# Patient Record
Sex: Male | Born: 1945 | Race: White | Hispanic: No | Smoking: Never smoker
Health system: Southern US, Community
[De-identification: ages and names within clinical notes are randomized; demographics above are authoritative.]

## PROBLEM LIST (undated history)

## (undated) DIAGNOSIS — I10 Essential (primary) hypertension: Secondary | ICD-10-CM

## (undated) DIAGNOSIS — M419 Scoliosis, unspecified: Secondary | ICD-10-CM

## (undated) DIAGNOSIS — F319 Bipolar disorder, unspecified: Secondary | ICD-10-CM

## (undated) DIAGNOSIS — E7800 Pure hypercholesterolemia, unspecified: Secondary | ICD-10-CM

## (undated) DIAGNOSIS — M1990 Unspecified osteoarthritis, unspecified site: Secondary | ICD-10-CM

## (undated) DIAGNOSIS — K649 Unspecified hemorrhoids: Secondary | ICD-10-CM

## (undated) HISTORY — DX: Unspecified osteoarthritis, unspecified site: M19.90

## (undated) HISTORY — DX: Scoliosis, unspecified: M41.9

## (undated) HISTORY — DX: Essential (primary) hypertension: I10

## (undated) HISTORY — DX: Unspecified hemorrhoids: K64.9

## (undated) HISTORY — DX: Bipolar disorder, unspecified: F31.9

## (undated) HISTORY — DX: Pure hypercholesterolemia, unspecified: E78.00

---

## 2001-09-12 ENCOUNTER — Inpatient Hospital Stay (HOSPITAL_COMMUNITY): Admission: EM | Admit: 2001-09-12 | Discharge: 2001-09-16 | Payer: Self-pay | Admitting: *Deleted

## 2004-04-24 ENCOUNTER — Emergency Department: Payer: Self-pay | Admitting: Emergency Medicine

## 2004-04-24 ENCOUNTER — Other Ambulatory Visit: Payer: Self-pay

## 2005-11-04 ENCOUNTER — Other Ambulatory Visit: Payer: Self-pay

## 2005-11-04 ENCOUNTER — Inpatient Hospital Stay: Payer: Self-pay | Admitting: Internal Medicine

## 2008-02-25 ENCOUNTER — Ambulatory Visit: Payer: Self-pay | Admitting: Unknown Physician Specialty

## 2008-04-14 ENCOUNTER — Ambulatory Visit: Payer: Self-pay | Admitting: Nephrology

## 2009-08-14 IMAGING — US US RENAL KIDNEY
1 series · 17 of 25 positions shown · non-contrast
Comparison: none

REASON FOR EXAM: chronic Kidney disease
COMMENTS:

[Series 1: us renal kidney · 17 of 39 slices shown]
[im 1/39]
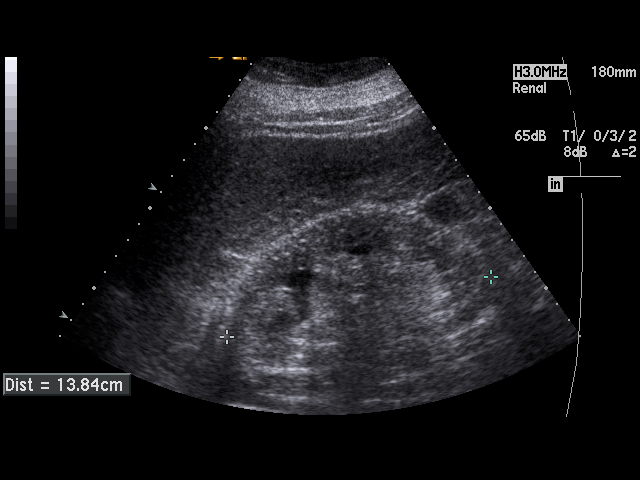
[im 4/39]
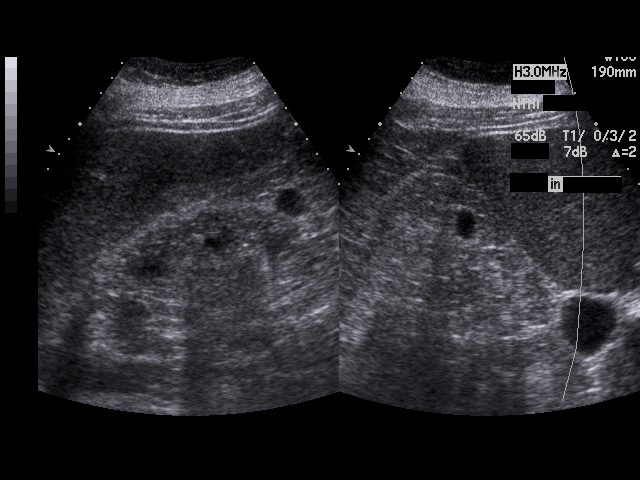
[im 5/39]
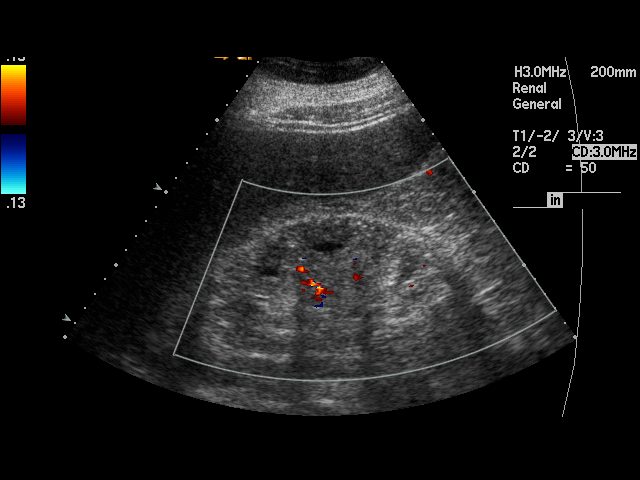
[im 8/39]
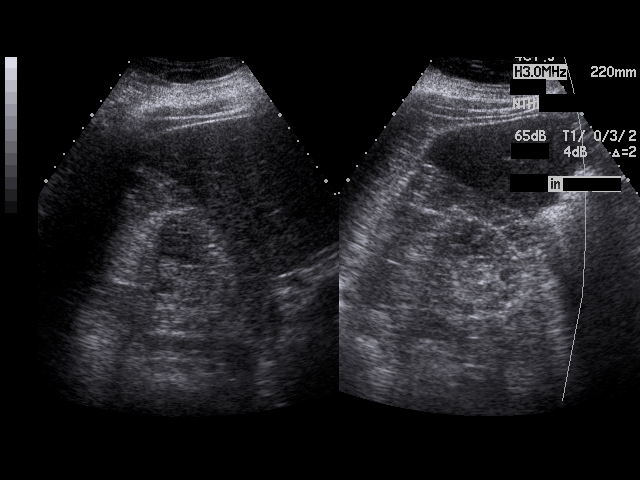
[im 10/39]
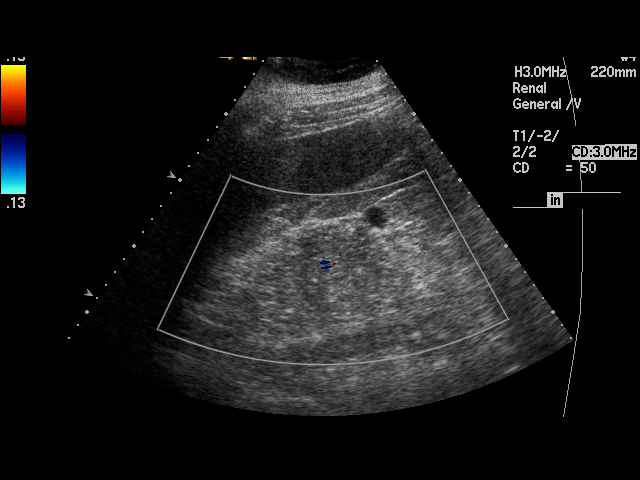
[im 13/39]
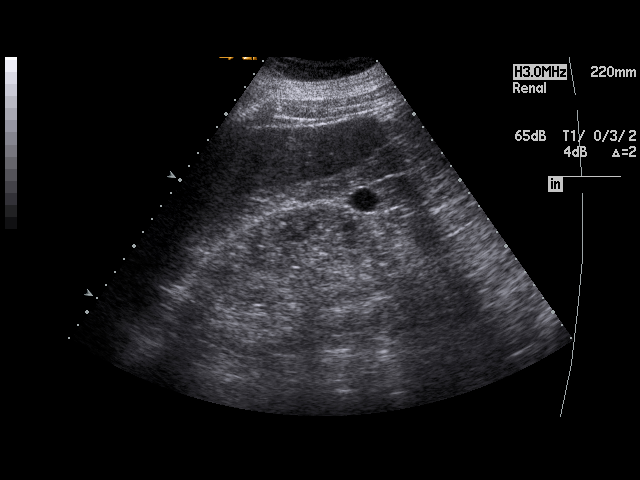
[im 15/39]
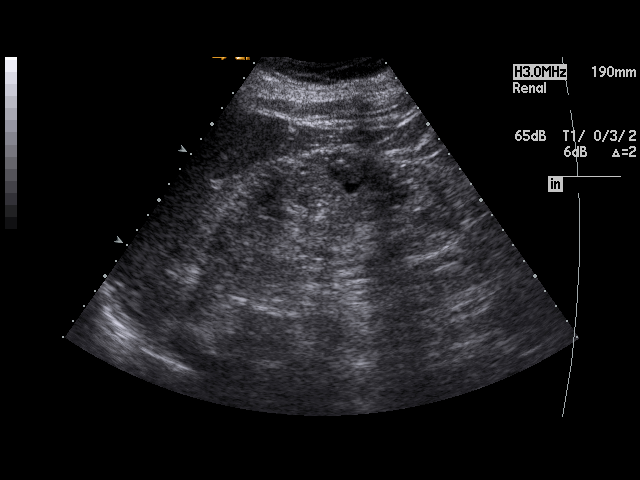
[im 18/39]
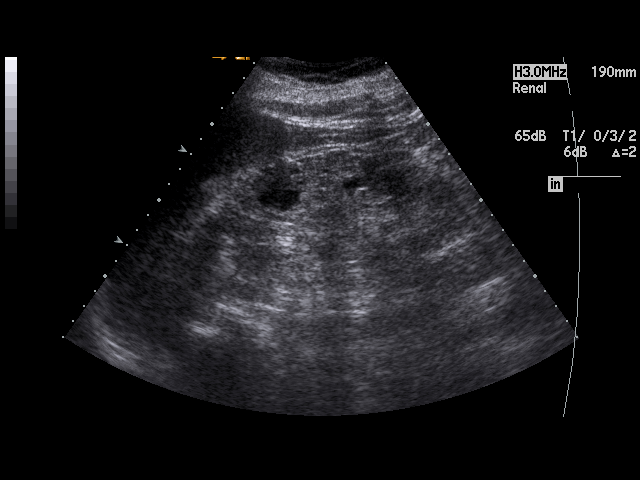
[im 20/39]
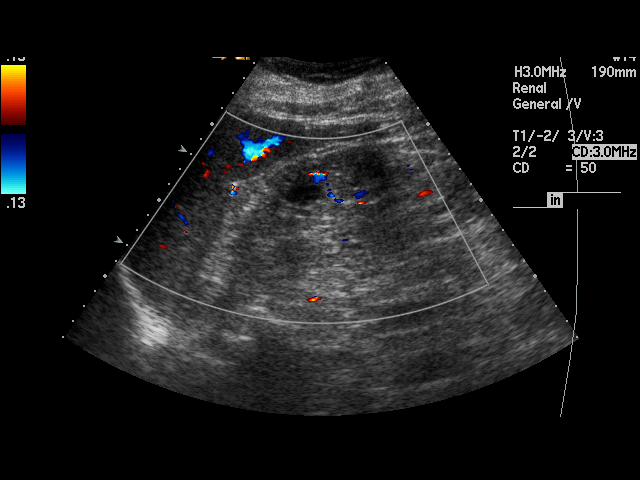
[im 21/39]
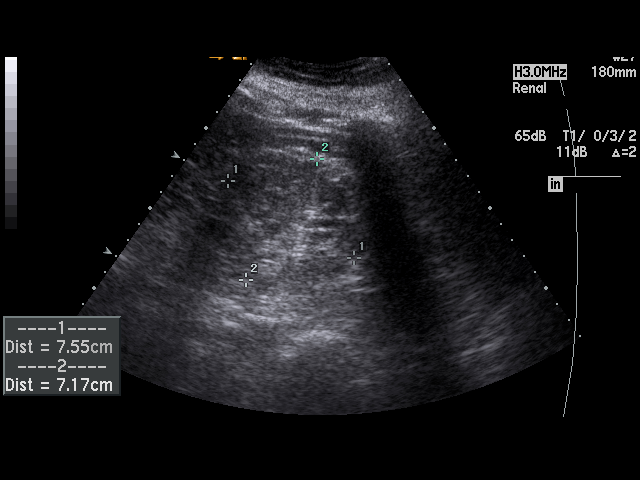
[im 24/39]
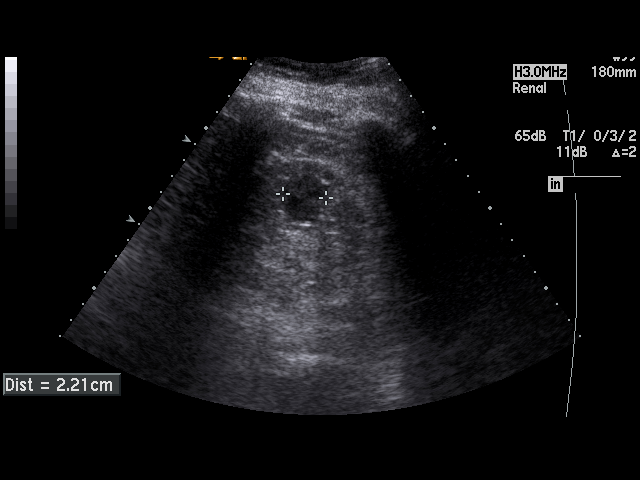
[im 26/39]
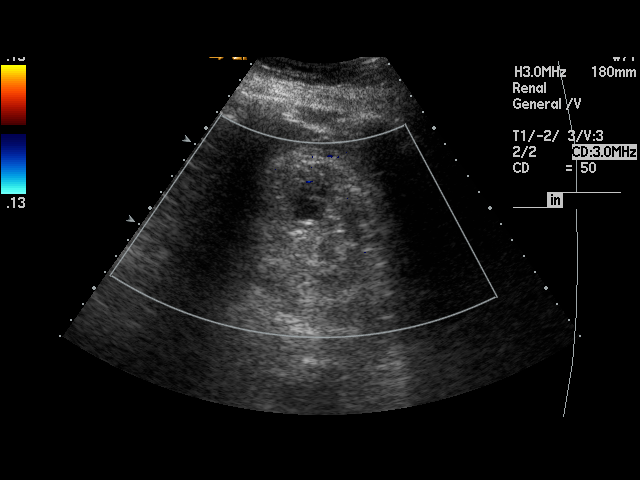
[im 29/39]
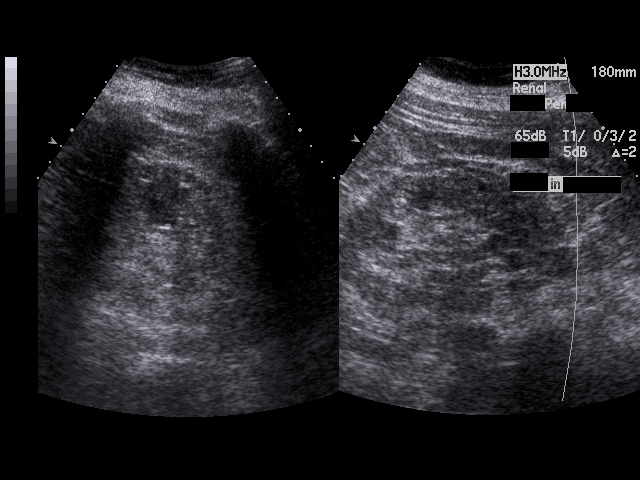
[im 31/39]
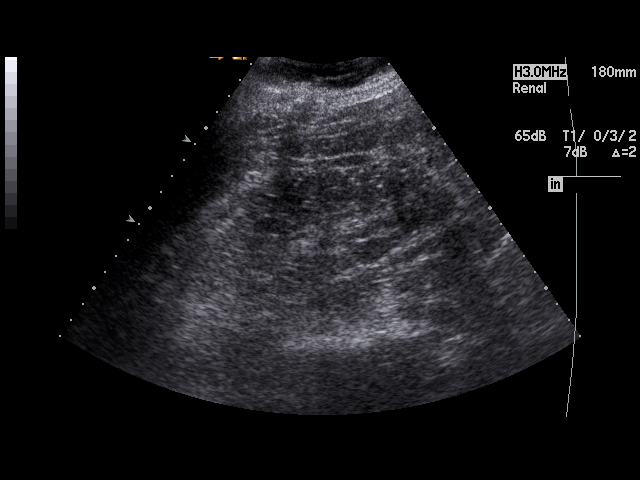
[im 34/39]
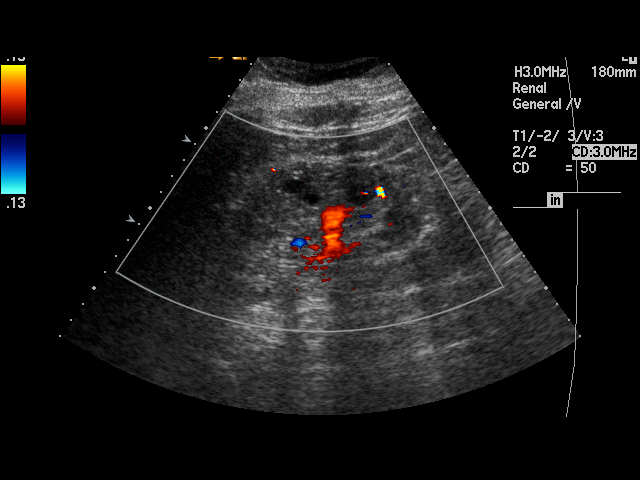
[im 35/39]
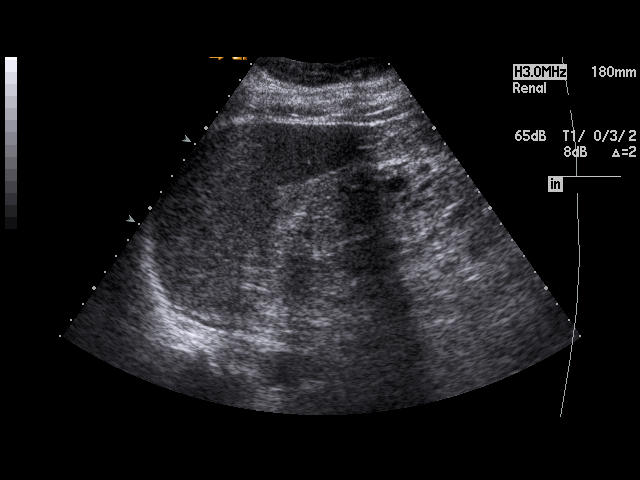
[im 39/39]
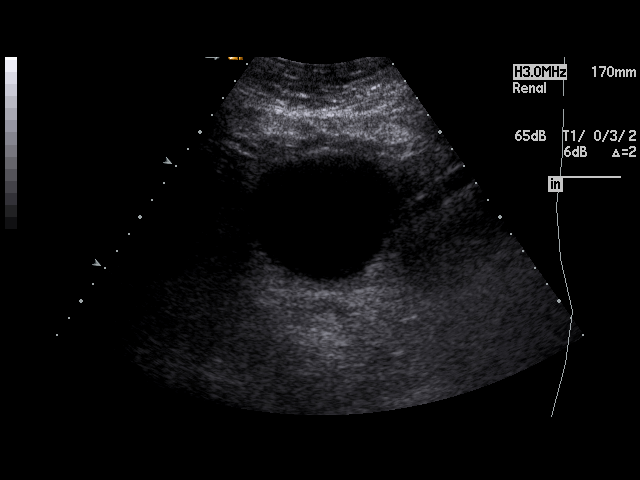

[17 of 25 positions shown; findings below may reference images not displayed]

PROCEDURE:     US  - US KIDNEY  - April 14, 2008  [DATE]

RESULT:     The kidneys bilaterally are hyperechogenic consistent with the
clinical history of renal insufficiency. The RIGHT kidney measures 13.84 cm
x 8.21 cm x 8.22 cm and the LEFT kidney measures 15.24 cm x 7.55 cm x
cm. There is 1.78 cm exophytic cyst at the lower pole of the RIGHT kidney.
In the LEFT kidney, there is a hypoechoic mass measuring 3.02 cm at maximum
diameter and located near the junction of the LEFT upper pole and LEFT
midpole region. There is at least some cystic component although echoes are
visualized in the major portion of the mass. The etiology for the mass is
not sonographically specific. A complex solid mass could produce this
appearance as could a cyst containing debris. No renal calcifications are
seen. There is no hydronephrosis. The urinary bladder is normal in
appearance.
IMPRESSION: 1. The kidneys show increased echogenicity bilaterally consistent with the
clinical history of renal insufficiency.
2. The renal cortical margins are smooth.
3. There is no hydronephrosis.
4. Incidentally noted is a cyst of the RIGHT kidney.
5. There is a nonspecific hypoechoic mass of the LEFT kidney as noted above.

## 2009-12-30 ENCOUNTER — Emergency Department: Payer: Self-pay | Admitting: Emergency Medicine

## 2011-03-09 ENCOUNTER — Encounter: Payer: Self-pay | Admitting: Family Medicine

## 2011-03-17 ENCOUNTER — Encounter: Payer: Self-pay | Admitting: Family Medicine

## 2011-04-16 ENCOUNTER — Encounter: Payer: Self-pay | Admitting: Family Medicine

## 2011-05-01 IMAGING — CR RIGHT FOOT COMPLETE - 3+ VIEW
1 series · 3 of 3 positions shown · non-contrast
Comparison: none

REASON FOR EXAM: pain intermittently for "awhile" worse this past week
COMMENTS:

[Series 1: view not recorded · 0.17mm/px · 3 of 3 slices shown]
[im 1/3]
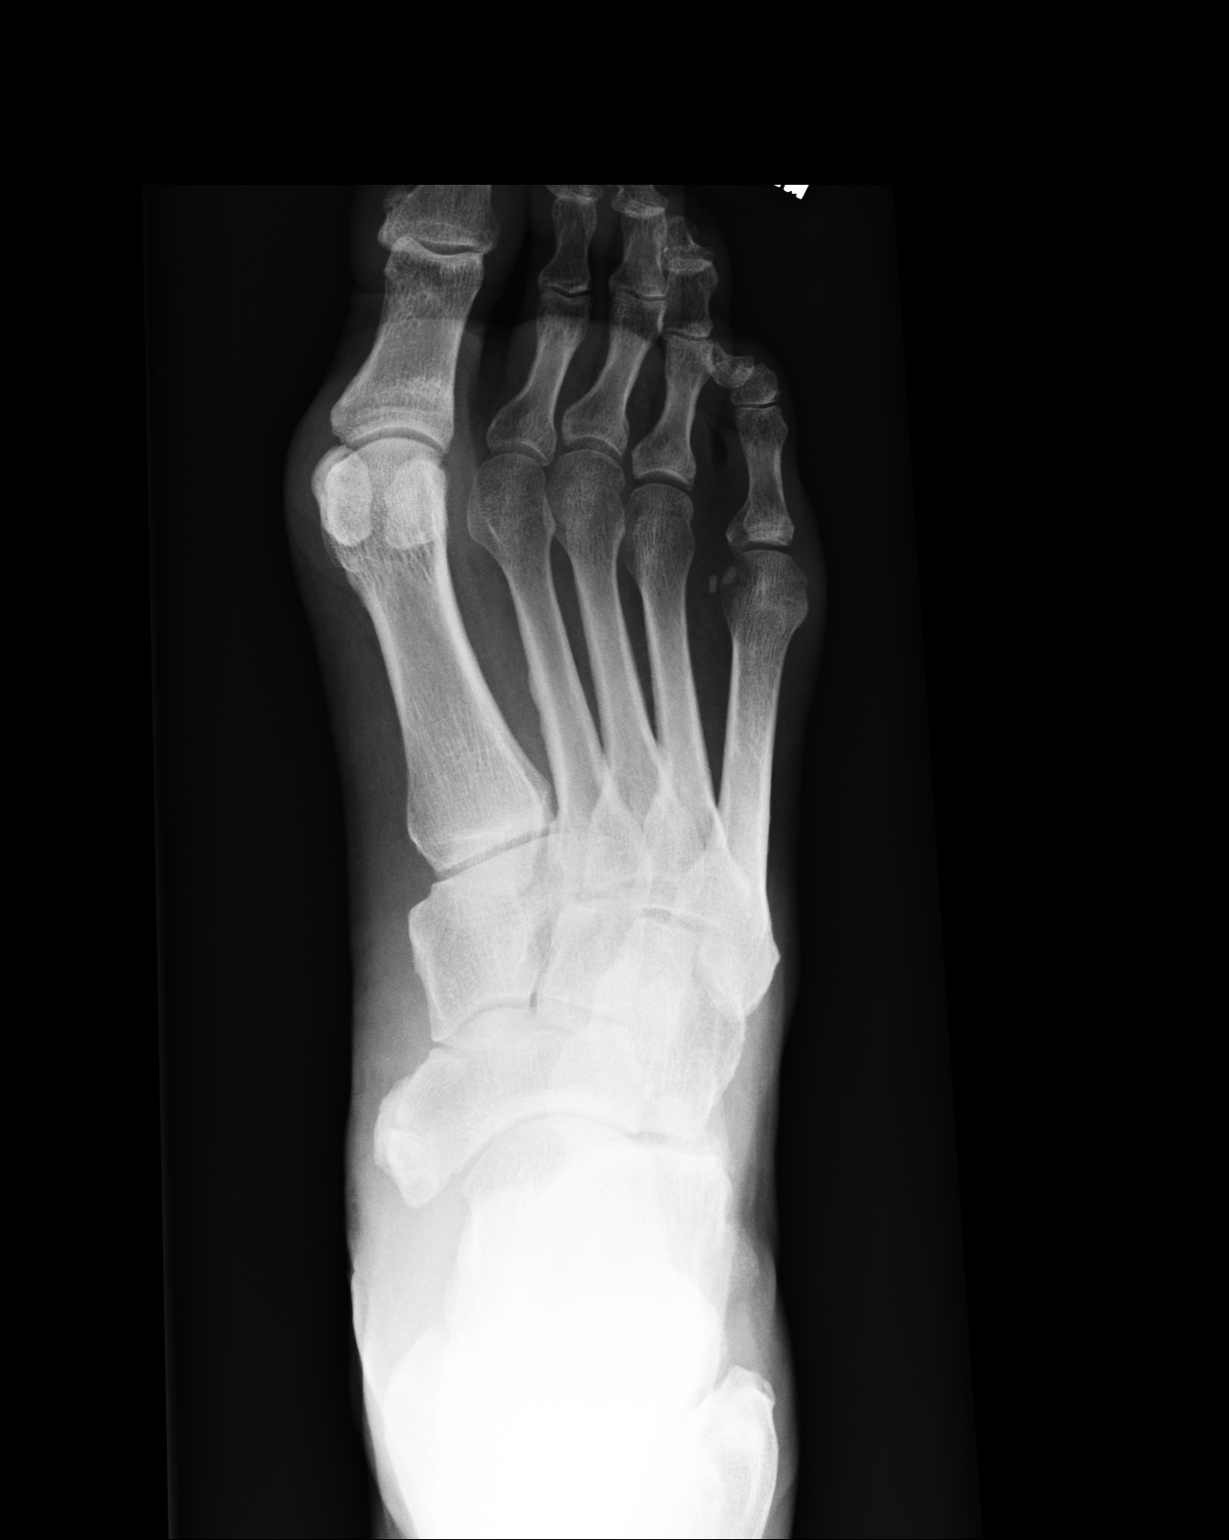
[im 2/3]
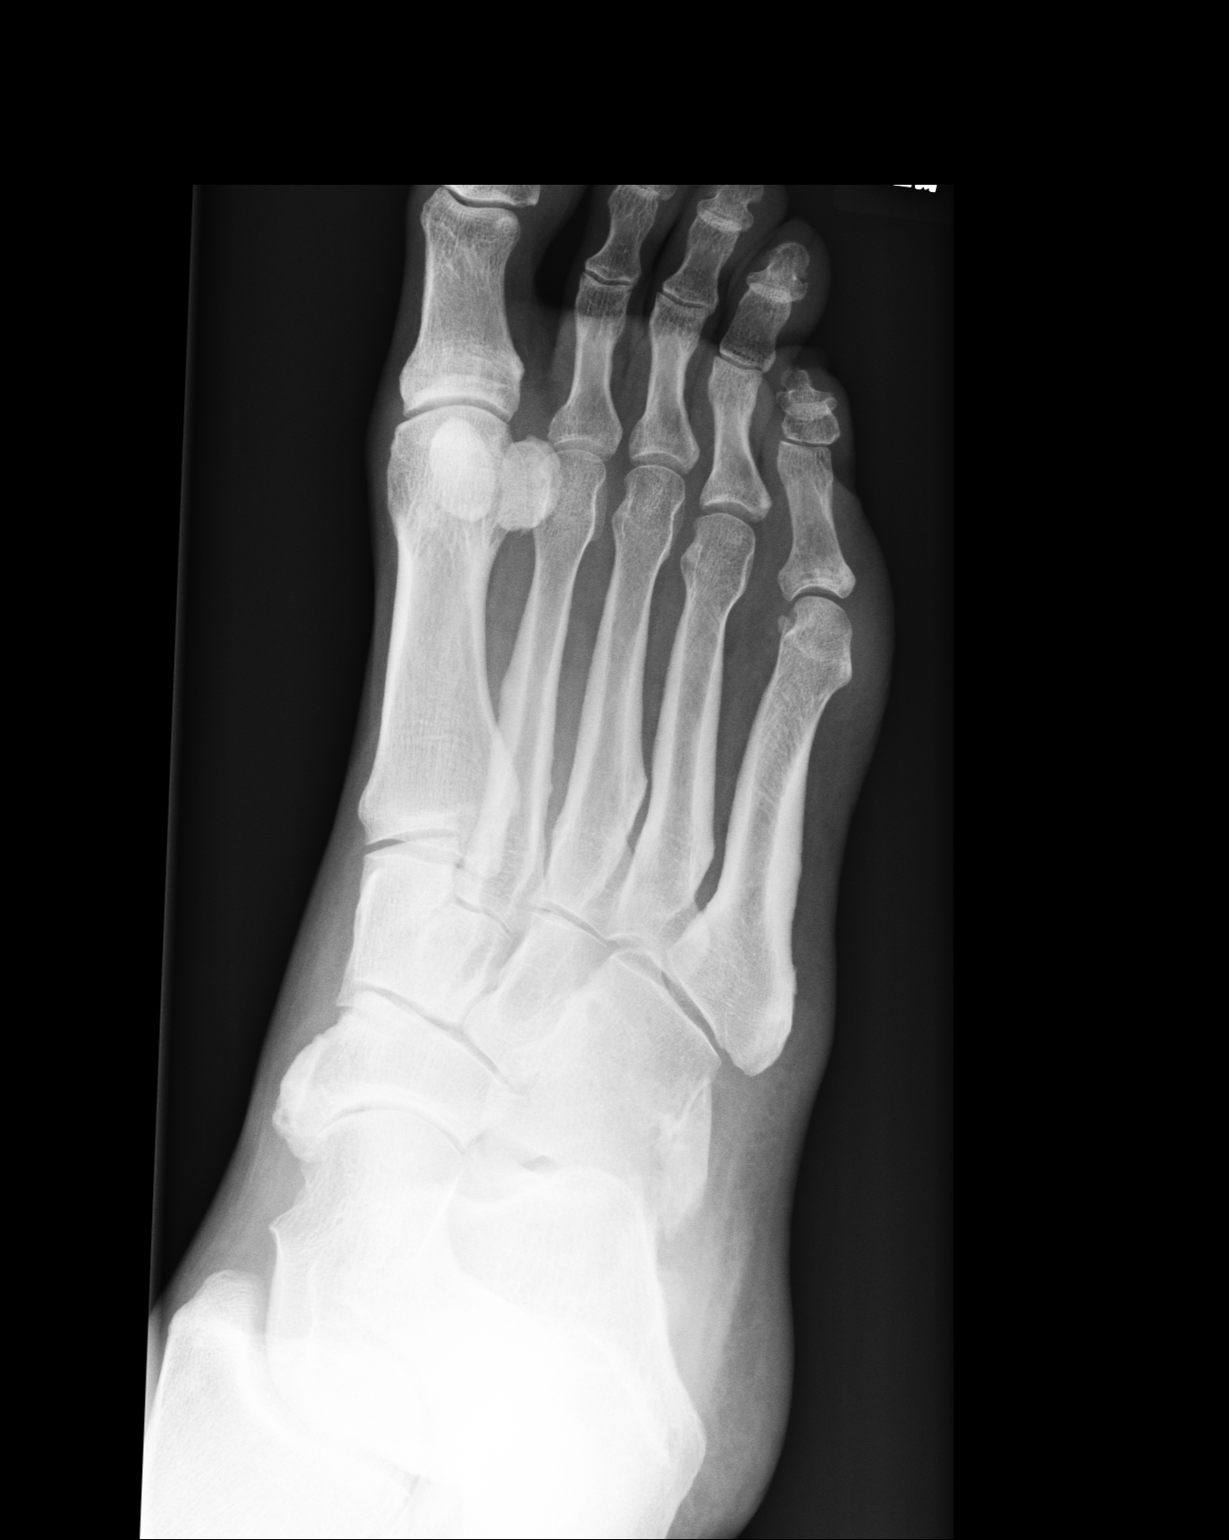
[im 3/3]
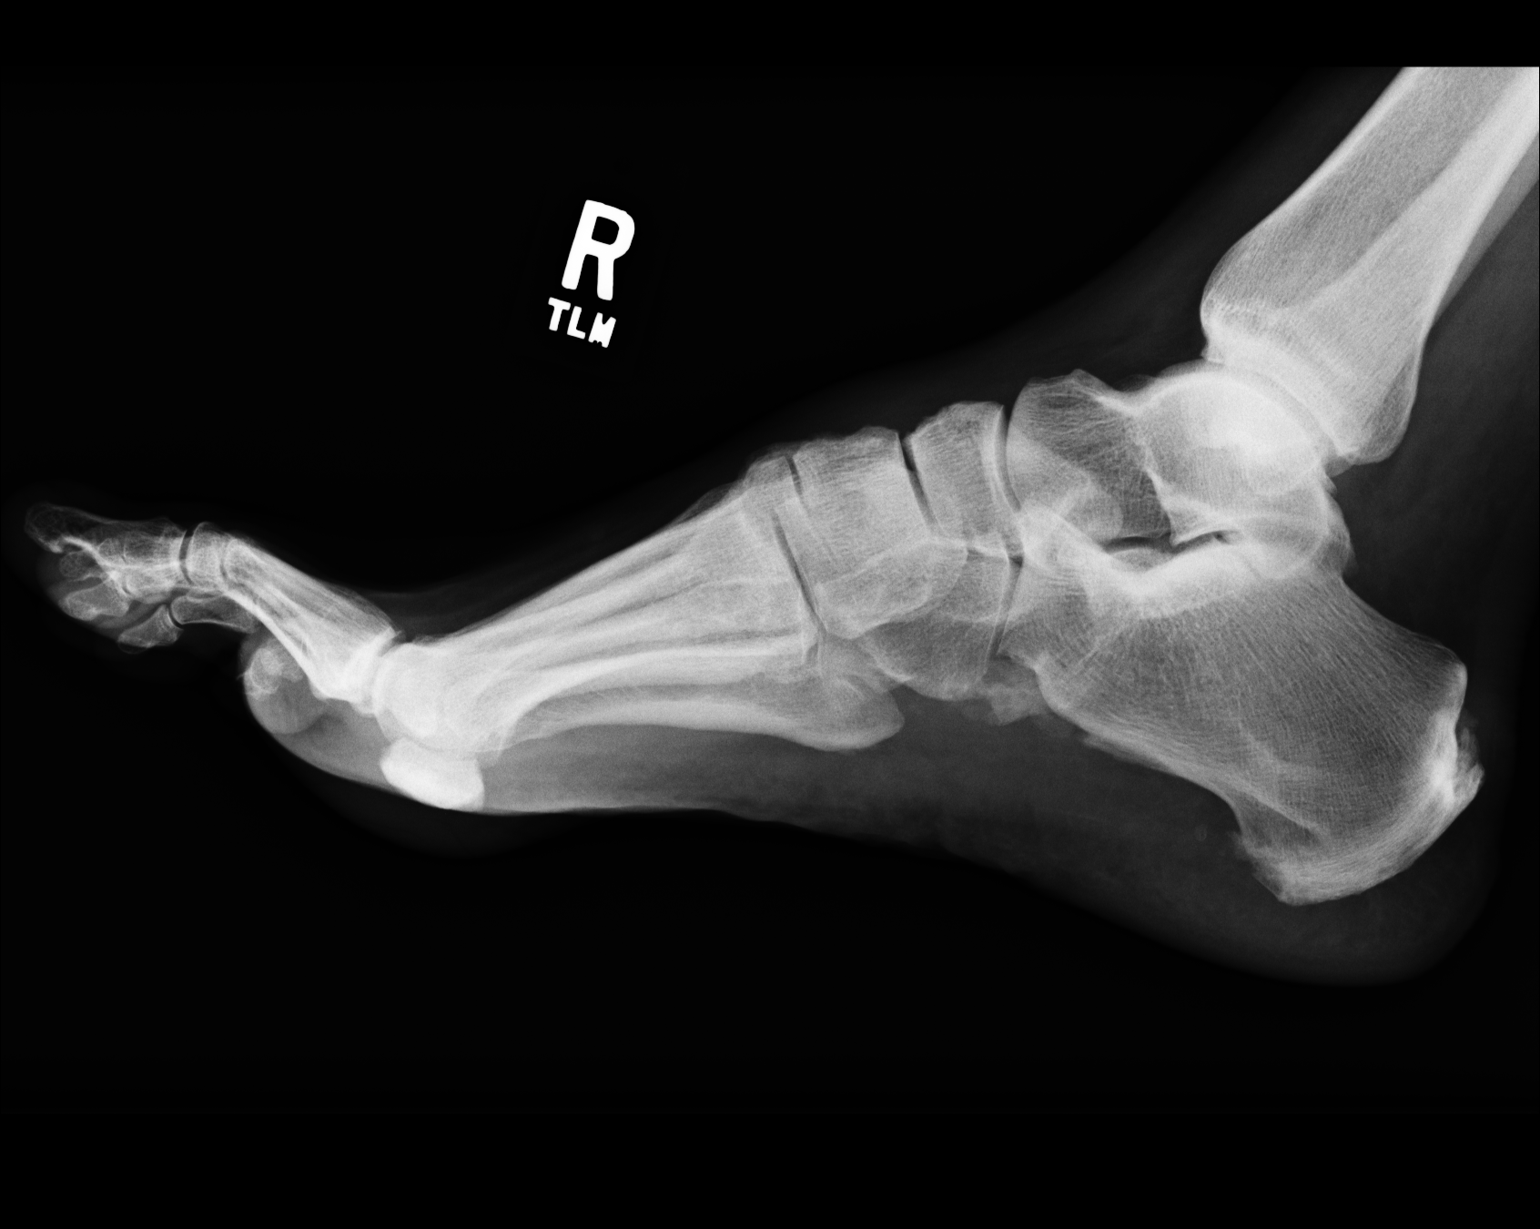

[3 of 3 positions shown; findings below may reference images not displayed]

PROCEDURE:     DXR - DXR FOOT RT COMPLETE W/OBLIQUES  - December 30, 2009  [DATE]

RESULT:     No fracture, dislocation or other acute bony abnormality is
identified.

There is noted a bony density measuring approximately 3.0 cm x 0.8 cm
projected lateral to the anterior aspect of the calcaneus and the lateral
aspect of the cuboid. The finding is consistant with a large sesmoid.
IMPRESSION: 1. No acute changes noted.
IMPRESSION:

## 2011-05-17 ENCOUNTER — Encounter: Payer: Self-pay | Admitting: Family Medicine

## 2012-08-30 ENCOUNTER — Other Ambulatory Visit: Payer: Self-pay | Admitting: Podiatry

## 2013-04-01 DIAGNOSIS — Z23 Encounter for immunization: Secondary | ICD-10-CM | POA: Insufficient documentation

## 2013-04-01 DIAGNOSIS — R0681 Apnea, not elsewhere classified: Secondary | ICD-10-CM | POA: Insufficient documentation

## 2013-07-15 ENCOUNTER — Ambulatory Visit: Payer: Self-pay | Admitting: Unknown Physician Specialty

## 2013-07-15 HISTORY — PX: COLONOSCOPY: SHX174

## 2013-07-18 LAB — PATHOLOGY REPORT

## 2013-10-28 ENCOUNTER — Encounter: Payer: Self-pay | Admitting: Podiatry

## 2013-10-28 ENCOUNTER — Ambulatory Visit: Payer: Self-pay | Admitting: Family Medicine

## 2013-10-28 ENCOUNTER — Ambulatory Visit (INDEPENDENT_AMBULATORY_CARE_PROVIDER_SITE_OTHER): Payer: Medicare Other

## 2013-10-28 ENCOUNTER — Ambulatory Visit (INDEPENDENT_AMBULATORY_CARE_PROVIDER_SITE_OTHER): Payer: Medicare Other | Admitting: Podiatry

## 2013-10-28 VITALS — BP 129/60 | HR 77 | Resp 18 | Ht 70.0 in | Wt 275.0 lb

## 2013-10-28 DIAGNOSIS — M722 Plantar fascial fibromatosis: Secondary | ICD-10-CM

## 2013-10-28 MED ORDER — METHYLPREDNISOLONE (PAK) 4 MG PO TABS: ORAL_TABLET | ORAL | Status: DC

## 2013-10-28 NOTE — Progress Notes (Signed)
   Subjective:    Patient ID: Keith Chapman, male    DOB: 03/18/1946, 68 y.o.   MRN: [id number]  HPI Comments: Heel pain in both feet, i have been to [facility], i have inserts for my shoes, i have had therapy. Heel cups . New shoes. Its been going for years. It gets better than worse , and now its just worse      Review of Systems  Cardiovascular: Positive for leg swelling.  Musculoskeletal: Positive for back pain.       Difficulty walking   Psychiatric/Behavioral:       Bipolar   All other systems reviewed and are negative.      Objective:   Physical Exam I have reviewed his past medical history medications allergies surgeries social history review of systems. Vital signs are stable he is alert and oriented x3 pulses are palpable bilateral. Muscle strength is 5 over 5 dorsiflexors plantar flexors inverters everters all intrinsic musculature is intact. Orthopedic evaluation demonstrates all joints distal to the ankle a full range of motion without crepitus with exception of the digits bilateral. Mallet toe deformity and hammertoe deformities with cavus foot type is noted bilateral. He has pain on palpation medial continued tubercles of the bilateral heels. Radiographic evaluation of the bilateral heels demonstrates severe spurring of the plantar fascia and the tendo Achilles plantarly as well as posteriorly. He also has spurring and calcification of the peroneal tendons. Cutaneous evaluation demonstrates supple well hydrated cutis with exception of the greater dystrophic possibly mycotic since of the nails. Assessment        Assessment & Plan:  Assessment: Plantar fasciitis chronic in nature bilateral.  Plan: Medrol Dosepak. He can to take nonsteroidals because of kidney failure. Injected the bilateral heels. Remembered metatarsal night splint left. I will followup with him in one month we discussed appropriate shoe gear stretching exercises ice therapy and shoe gear  modifications.

## 2013-10-28 NOTE — Patient Instructions (Signed)

## 2013-11-25 ENCOUNTER — Ambulatory Visit: Payer: 59 | Admitting: Podiatry

## 2013-11-27 ENCOUNTER — Encounter: Payer: Self-pay | Admitting: Podiatry

## 2013-11-27 ENCOUNTER — Ambulatory Visit (INDEPENDENT_AMBULATORY_CARE_PROVIDER_SITE_OTHER): Payer: Medicare Other | Admitting: Podiatry

## 2013-11-27 DIAGNOSIS — M722 Plantar fascial fibromatosis: Secondary | ICD-10-CM

## 2013-11-27 NOTE — Progress Notes (Signed)
He presents today for followup of his plantar fasciitis. No details the nerves that he had very little improvement he explained to me that he were not hurting nearly is deathly they have been in the past.  Objective: Vital signs are stable he is alert and oriented x3. He has minimal pain on palpation medial continued tubercles bilateral.  Assessment: Plantar fasciitis resolving bilateral heels.  Plan: Injected bilateral heels today and applied a plantar fascial strapping. We will followup with him in one month.

## 2013-12-25 ENCOUNTER — Ambulatory Visit (INDEPENDENT_AMBULATORY_CARE_PROVIDER_SITE_OTHER): Payer: Medicare Other | Admitting: Podiatry

## 2013-12-25 VITALS — BP 114/58 | HR 71 | Resp 16

## 2013-12-25 DIAGNOSIS — M722 Plantar fascial fibromatosis: Secondary | ICD-10-CM

## 2013-12-25 NOTE — Progress Notes (Signed)
Presents today for followup of his plantar fasciitis if it bothers him distal little bit but it is much better than it was previously. He states that he hopes to be able to dance at his birthday and his daughters wedding which are coming up.  Objective: Vital signs are stable he is alert oriented x3. Pain on palpation plantar calcaneal tubercles bilateral. Pulses are palpable no calf pain.  Assessment: Pain in limb secondary to plantar fasciitis bilateral.  Plan: Injected bilateral heels today with Kenalog and local anesthetic applied strapping.

## 2014-02-05 ENCOUNTER — Ambulatory Visit (INDEPENDENT_AMBULATORY_CARE_PROVIDER_SITE_OTHER): Payer: Medicare Other | Admitting: Podiatry

## 2014-02-05 VITALS — BP 109/57 | HR 80 | Resp 16

## 2014-02-05 DIAGNOSIS — M722 Plantar fascial fibromatosis: Secondary | ICD-10-CM

## 2014-02-05 NOTE — Progress Notes (Signed)
He presents today states that he's been walking for exercise and his heels are starting to hurt once again. He like to consider another set of injections of possible.  Objective: Pulses are stable he is alert and oriented x3. Pulses are strongly palpable bilateral. Neurologic sensorium is intact per Semmes-Weinstein monofilament. He has pain on palpation medial continued tubercles bilateral. They are warm to the touch.  Assessment: Inflammation with plantar fasciitis of the bilateral heels.  Plan: Reinjected with Kenalog and local anesthetic to the point of maximal tenderness bilateral heels. He will continue all conservative therapies.

## 2014-03-19 ENCOUNTER — Ambulatory Visit: Payer: Medicare Other | Admitting: Podiatry

## 2014-03-26 ENCOUNTER — Ambulatory Visit (INDEPENDENT_AMBULATORY_CARE_PROVIDER_SITE_OTHER): Payer: Medicare Other | Admitting: Podiatry

## 2014-03-26 VITALS — BP 123/61 | HR 73 | Resp 16

## 2014-03-26 DIAGNOSIS — M722 Plantar fascial fibromatosis: Secondary | ICD-10-CM

## 2014-03-26 NOTE — Progress Notes (Signed)
He presents today complaining of bilateral heel pain syndrome like to have another set of injections of his all possible.  Objective: Pulses are strongly palpable bilateral. He has pain on direct palpation plantar calcaneal tubercle and medial calcaneal tubercle at the plantar fascial calcaneal insertion site.  Assessment: Plantar fasciitis middle band and medial band bilateral.  Plan: We discussed etiology pathology conservative versus surgical therapies discussed appropriate shoe view stretching exercises ice therapy and shoe gear modifications. I also injected bilateral heels with Kenalog and local anesthetic.

## 2014-09-08 ENCOUNTER — Ambulatory Visit
Admit: 2014-09-08 | Disposition: A | Discharge status: Home / Self Care | Payer: Self-pay | Attending: Internal Medicine | Admitting: Internal Medicine

## 2014-09-08 LAB — CBC CANCER CENTER
BASOS PCT: 0.4 %
Basophil #: 0 x10 3/mm (ref 0.0–0.1)
EOS PCT: 2.6 %
Eosinophil #: 0.1 x10 3/mm (ref 0.0–0.7)
HCT: 25.2 % — ABNORMAL LOW (ref 40.0–52.0)
HGB: 8.2 g/dL — ABNORMAL LOW (ref 13.0–18.0)
Lymphocyte #: 1.2 x10 3/mm (ref 1.0–3.6)
Lymphocyte %: 24.9 %
MCH: 31.2 pg (ref 26.0–34.0)
MCHC: 32.5 g/dL (ref 32.0–36.0)
MCV: 96 fL (ref 80–100)
MONO ABS: 0.4 x10 3/mm (ref 0.2–1.0)
Monocyte %: 8.3 %
NEUTROS ABS: 3.1 x10 3/mm (ref 1.4–6.5)
NEUTROS PCT: 63.8 %
Platelet: 112 x10 3/mm — ABNORMAL LOW (ref 150–440)
RBC: 2.62 10*6/uL — AB (ref 4.40–5.90)
RDW: 13.8 % (ref 11.5–14.5)
WBC: 4.8 x10 3/mm (ref 3.8–10.6)

## 2014-09-08 LAB — LACTATE DEHYDROGENASE: LDH: 178 U/L

## 2014-09-08 LAB — RETICULOCYTES
Absolute Retic Count: 0.0406 10*6/uL (ref 0.019–0.186)
RETICULOCYTE: 1.55 % (ref 0.4–3.1)

## 2014-09-08 LAB — HEPATIC FUNCTION PANEL A (ARMC)
Albumin: 3.2 g/dL — ABNORMAL LOW
Alkaline Phosphatase: 66 U/L
BILIRUBIN TOTAL: 0.6 mg/dL
SGOT(AST): 36 U/L
SGPT (ALT): 20 U/L
Total Protein: 6.7 g/dL

## 2014-09-08 LAB — CREATININE, SERUM
Creatine, Serum: 3.63 mg/dl
Creatinine: 3.63 mg/dL — ABNORMAL HIGH
EGFR (African American): 19 — ABNORMAL LOW
EGFR (Non-African Amer.): 16 — ABNORMAL LOW

## 2014-09-08 LAB — IRON AND TIBC
IRON BIND. CAP.(TOTAL): 404 (ref 250–450)
IRON SATURATION: 28.7
Iron: 116 ug/dL
UNBOUND IRON-BIND. CAP.: 287.6

## 2014-09-08 LAB — FERRITIN: FERRITIN (ARMC): 260 ng/mL

## 2014-09-10 LAB — PROT IMMUNOELECTROPHORES(ARMC)

## 2014-09-10 LAB — KAPPA/LAMBDA FREE LIGHT CHAINS (ARMC)

## 2014-09-16 ENCOUNTER — Other Ambulatory Visit: Payer: Self-pay | Admitting: Family Medicine

## 2014-09-17 ENCOUNTER — Other Ambulatory Visit: Payer: Self-pay

## 2014-09-17 ENCOUNTER — Encounter: Payer: Self-pay | Admitting: Internal Medicine

## 2014-09-23 NOTE — Progress Notes (Signed)
Erroneous encounter This encounter was created in error - please disregard. 

## 2014-09-25 ENCOUNTER — Encounter: Payer: Self-pay | Admitting: Internal Medicine

## 2014-09-25 ENCOUNTER — Other Ambulatory Visit: Payer: Self-pay | Admitting: *Deleted

## 2014-09-25 ENCOUNTER — Inpatient Hospital Stay: Payer: Medicare Other | Attending: Internal Medicine

## 2014-09-25 ENCOUNTER — Inpatient Hospital Stay (HOSPITAL_BASED_OUTPATIENT_CLINIC_OR_DEPARTMENT_OTHER): Payer: Medicare Other | Admitting: Internal Medicine

## 2014-09-25 VITALS — BP 110/67 | HR 60 | Temp 97.0°F | Resp 16 | Wt 265.7 lb

## 2014-09-25 DIAGNOSIS — Z79899 Other long term (current) drug therapy: Secondary | ICD-10-CM | POA: Insufficient documentation

## 2014-09-25 DIAGNOSIS — D638 Anemia in other chronic diseases classified elsewhere: Secondary | ICD-10-CM | POA: Diagnosis present

## 2014-09-25 DIAGNOSIS — D649 Anemia, unspecified: Secondary | ICD-10-CM

## 2014-09-25 DIAGNOSIS — N189 Chronic kidney disease, unspecified: Secondary | ICD-10-CM | POA: Insufficient documentation

## 2014-09-25 DIAGNOSIS — D696 Thrombocytopenia, unspecified: Secondary | ICD-10-CM | POA: Diagnosis not present

## 2014-09-25 LAB — CBC
HCT: 26.3 % — ABNORMAL LOW (ref 40.0–52.0)
HEMOGLOBIN: 8.7 g/dL — AB (ref 13.0–18.0)
MCH: 31.7 pg (ref 26.0–34.0)
MCHC: 33.1 g/dL (ref 32.0–36.0)
MCV: 95.8 fL (ref 80.0–100.0)
Platelets: 135 10*3/uL — ABNORMAL LOW (ref 150–440)
RBC: 2.74 MIL/uL — ABNORMAL LOW (ref 4.40–5.90)
RDW: 14.5 % (ref 11.5–14.5)
WBC: 7.1 10*3/uL (ref 3.8–10.6)

## 2014-09-25 NOTE — Progress Notes (Signed)
, [place] note   Referred by Maryland Pink, MD [address] [facility] [place], [place] [address]                   patient also sees Dr. Raul Del and Dr. Josefa Half and Dr. Lily Lovings is for nephrology and Dr. Kasandra Knudsen for psychiatry   This 69 y.o. male patient presents to the clinic for follow-up of problems including anemia, thrombocytopenia, chronic renal failure, positive family history of malignancy.. Initial visit was 09/08/14, see that note in [facility]   Chief Complaint/Problem List:  1. Anemia, was 8.2 on April 25, was 10.2 recently, was 11.7 in 2007, is up today to 8.72. Thrombocytopenia, was recently 118,000, was 100,000 on March 30, was normal 216 weeks prior so thrombocytopenias a recent issue, is up to 130 2K today #3 chronic renal failure creatinine was 3.2 back in 2004 ranges up to 3.9 in 2007 4. Report noted  cyst versus a mass on a renal ultrasound 2009, but November 2010 renal ultrasound in the [facility] shows only benign cysts  5. Positive family history of malignancy 6. Borderline elevated serum free kappa light chains 7. Small pulmonary nodules on a CT scan at [facility] in January 2011    HPI: Patient returns today for evaluation. Since initial visit have shown multiple studies normal, workup now includes normal HIV ,normal hepatitis C , normal LDH normal serum iron and iron saturation normal Coombs test normal B12 level normal SIEP. The light chains are slightly elevated and the light chain ratio is slightly elevated at 2.05     Review of Systems:  General: No acute distress, No fatigue, No recent weight loss, fever chills or sweats   HEENT: No headache, dizziness, ear or jaw pain, or epistaxis   Lungs: No cough, shortness of breath, at rest, No SOBOE, No wheezing, No chest pain, No, hemoptysis  Cardiac: no chest pain, no palpitations, no orthostasis, no lower extremity    GI: no abdominal pain, nausea, vomiting, diahrrea, or reflux   GU: no dysuria, no hematuria, no  vaginal bleeding  Musculoskeletal: no back pain, no bone pain, no acutely painful joints,   Extremities: no upper or lower extremity edema  Skin: no bruising, no rash  Neuro: no headache, no dizzy, no focal weakness   Psych: no anxiety, no depression   Allergies Allergies  Allergen Reactions  . Clonazepam Rash    passed out Other reaction(s): Other (See Comments) passed out Other reaction(s): Other (See Comments) passed out  . Olanzapine Other (See Comments) and Rash    passed out passed out passed out    Significant History/PMH:        Smoking History: Never smoker, Past Smoker, Current smoker, No interested in quitting  PFSH: Family History:  Family History  Problem Relation Age of Onset  . Breast cancer Mother     GREATER THAN 92 YRS OF AGE; NO LIVING  . Breast cancer Sister     GREATER THAN 30 YRS OLD; still living  . Breast cancer Sister     GREATER THAN 31 YRS OLD  . Colon cancer Father   . Melanoma Father     metstatic melanoma    Comments:   Social History: Not a smoker. History includes multiple hospitalizations at [facility] History  Alcohol Use No    Additional Past Medical and Surgical History: Correction to family history above, one sister's cancer was  age 82s , colon cancer father was  elderly    Home Medications: Prior to Admission medications   Medication Sig Start Date End Date Taking? Authorizing Provider  ARIPiprazole (ABILIFY) 30 MG tablet Take 30 mg by mouth.    Yes Historical Provider, MD  aspirin EC 81 MG tablet Take 81 mg by mouth.    Yes Historical Provider, MD  Biotin 1000 MCG tablet Take 5,000 mcg by mouth 3 (three) times daily.   Yes Historical Provider, MD  buPROPion (WELLBUTRIN) 75 MG tablet Take 75 mg by mouth 2 (two) times daily.    Yes Historical Provider, MD  Cholecalciferol (VITAMIN D-1000 MAX ST) 1000 UNITS tablet Take by mouth.   Yes Historical Provider, MD  divalproex (DEPAKOTE) 500 MG DR tablet Take 2,000 mg  by mouth at bedtime.    Yes Historical Provider, MD  fenofibrate micronized (LOFIBRA) 67 MG capsule Take 67 mg by mouth. 06/19/12  Yes Historical Provider, MD  Ferrous Sulfate (IRON) 90 (18 FE) MG TABS Take by mouth.   Yes Historical Provider, MD  irbesartan (AVAPRO) 75 MG tablet Take by mouth.   Yes Historical Provider, MD  oxcarbazepine (TRILEPTAL) 600 MG tablet Take by mouth.   Yes Historical Provider, MD  Probiotic Product (PROBIOTIC FORMULA) CAPS Take 1 capsule by mouth 1 day or 1 dose.   Yes Historical Provider, MD  pyridOXINE (VITAMIN B-6) 100 MG tablet Take by mouth.   Yes Historical Provider, MD  QUEtiapine (SEROQUEL) 400 MG tablet Take 400 mg by mouth at bedtime.    Yes Historical Provider, MD  tamsulosin (FLOMAX) 0.4 MG CAPS capsule Take 0.4 mg by mouth daily.   Yes Historical Provider, MD    Vital Signs:  Blood pressure 110/67, pulse 60, temperature 97 F (36.1 C), temperature source Tympanic, resp. rate 16, weight 265 lb 10.5 oz (120.5 kg), SpO2 97 %.  Physical Exam:  General: well developed, well nourished, and no acute distress  Mental Status: alert and oriented to person, place and time, slightly anxious speech and concentration more focused compared to previous   Head, Ears, Nose,Throat: No thrush  Respiratory: no rales, rhonchi, or wheezing, no dullness  Cardiovascular: regular rate and rhythm  Gastrointestinal: soft, non tender, no masses or organomegaly  Musculoskeletal: no lower extremity edema no calf tenderness  Skin: no rashes, no bruises  Neurological: No gross focal weakness cranial nerves intact  Lymphatics: Not palpable, neck supraclavicular, submandibular, axilla        Laboratory Results: Appointment on 09/25/2014  Component Date Value Ref Range Status  . WBC 09/25/2014 7.1  3.8 - 10.6 K/uL Final  . RBC 09/25/2014 2.74* 4.40 - 5.90 MIL/uL Final  . Hemoglobin 09/25/2014 8.7* 13.0 - 18.0 g/dL Final  . HCT 09/25/2014 26.3* 40.0 - 52.0 % Final  . MCV  09/25/2014 95.8  80.0 - 100.0 fL Final  . MCH 09/25/2014 31.7  26.0 - 34.0 pg Final  . MCHC 09/25/2014 33.1  32.0 - 36.0 g/dL Final  . RDW 09/25/2014 14.5  11.5 - 14.5 % Final  . Platelets 09/25/2014 135* 150 - 440 K/uL Final          Radiology Results: No results found.         Assessment and Plan: Impression: See also problem list above with regard to anemia looks like chronic disease and chronic renal failure. Iron studies are normal. No signs of bleeding. Could be early mild dysplastic syndrome or drug effect. Similar differential diagnoses with regard to thrombocytopenia low this does appear new. It is  also improving spontaneously could've just been intercurrent infection. Want to rule out splenomegaly and hypersplenism    Plan: Additional studies with a NA and then RA serology. Then ultrasound to look at liver and spleen. Later would look at possible genetic advice and testing.

## 2014-09-26 LAB — ANTINUCLEAR ANTIBODIES, IFA

## 2014-09-26 LAB — FANA STAINING PATTERNS: Speckled Pattern: 1:160 {titer} — ABNORMAL HIGH

## 2014-09-26 LAB — RHEUMATOID FACTOR: Rhuematoid fact SerPl-aCnc: 9.4 IU/mL (ref 0.0–13.9)

## 2014-09-29 IMAGING — US ABDOMEN ULTRASOUND LIMITED
1 series · 14 of 25 positions shown · non-contrast
Comparison: None.

CLINICAL DATA: LIVER abd pain  for 2 mos

EXAM:
US ABDOMEN LIMITED - RIGHT UPPER QUADRANT

[Series 1: abdomen ultrasound limited · 0.34mm/px · 14 of 32 slices shown]
[im 1/32]
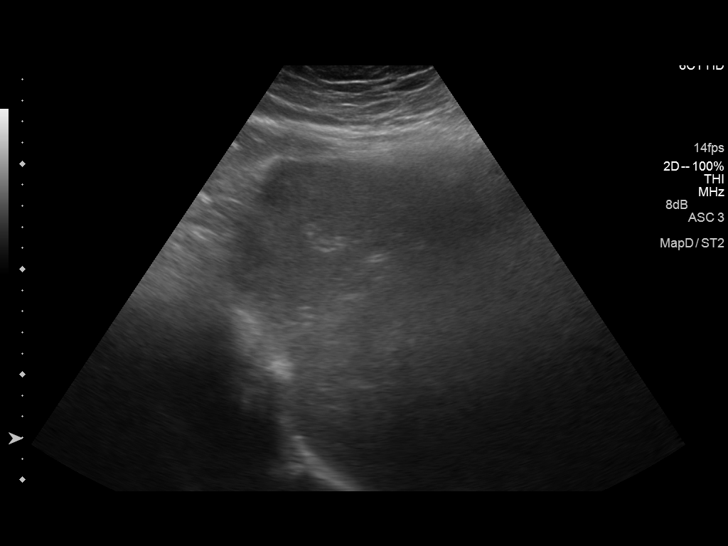
[im 3/32]
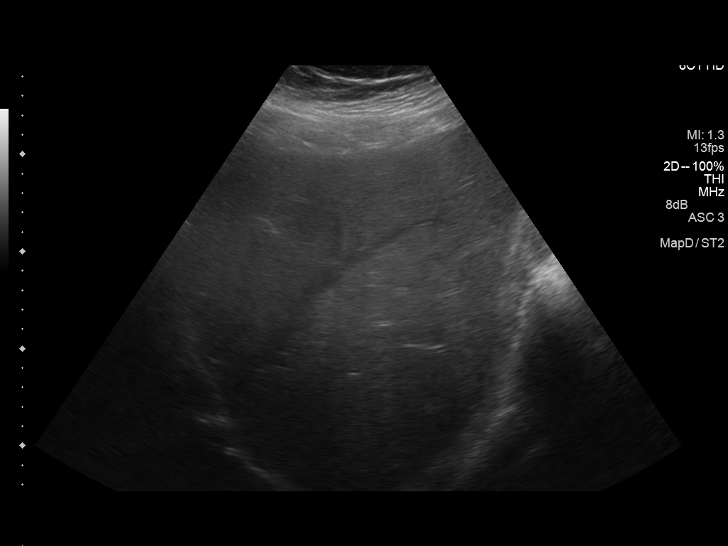
[im 6/32]
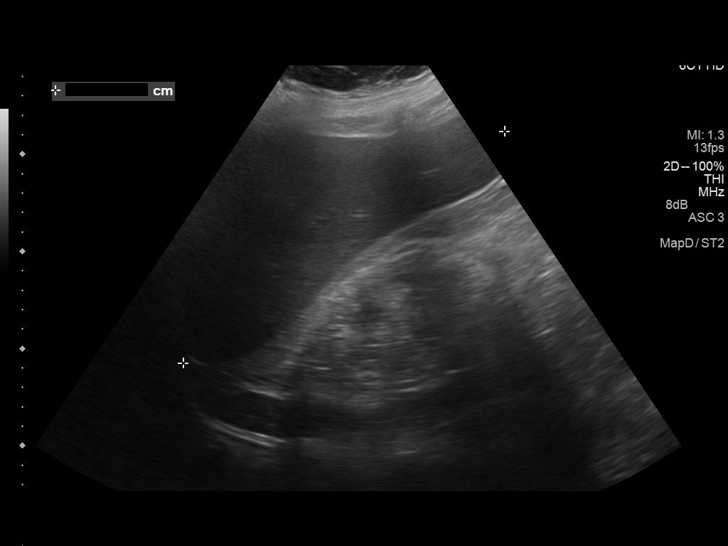
[im 8/32]
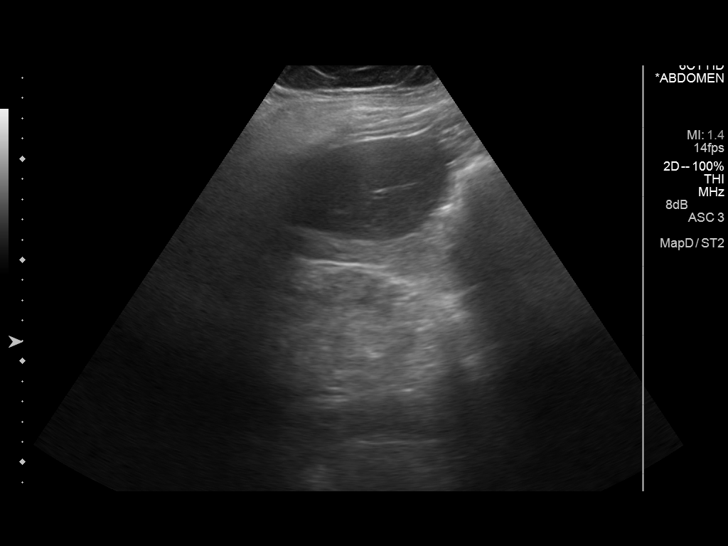
[im 11/32]
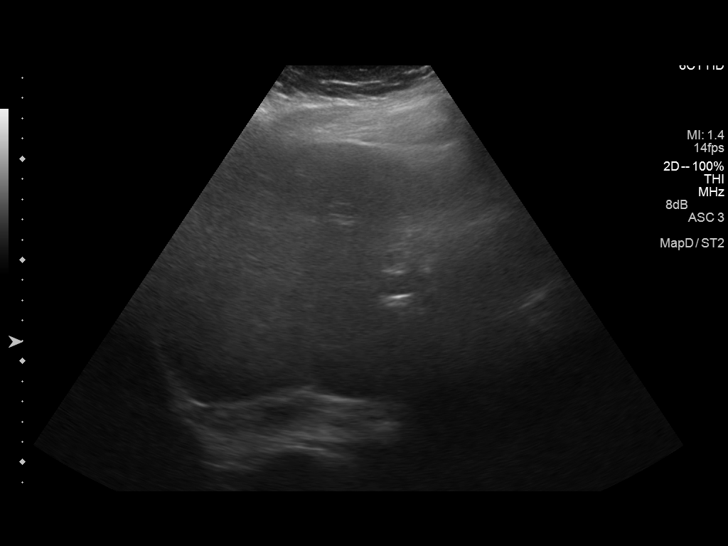
[im 12/32]
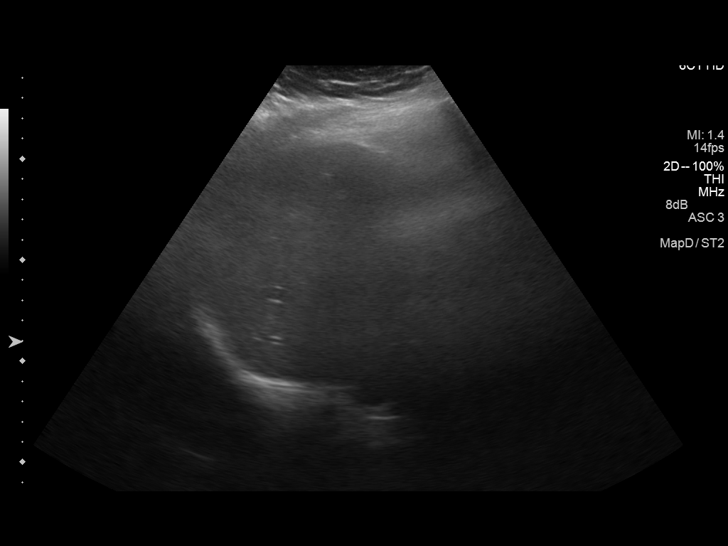
[im 15/32]
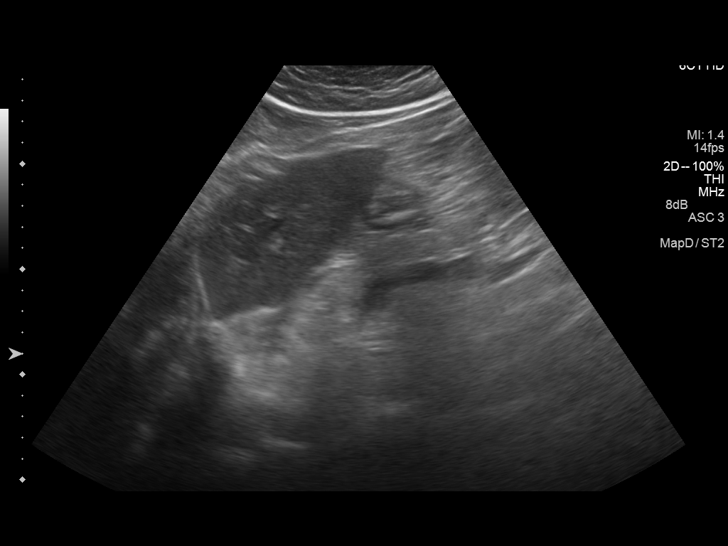
[im 17/32]
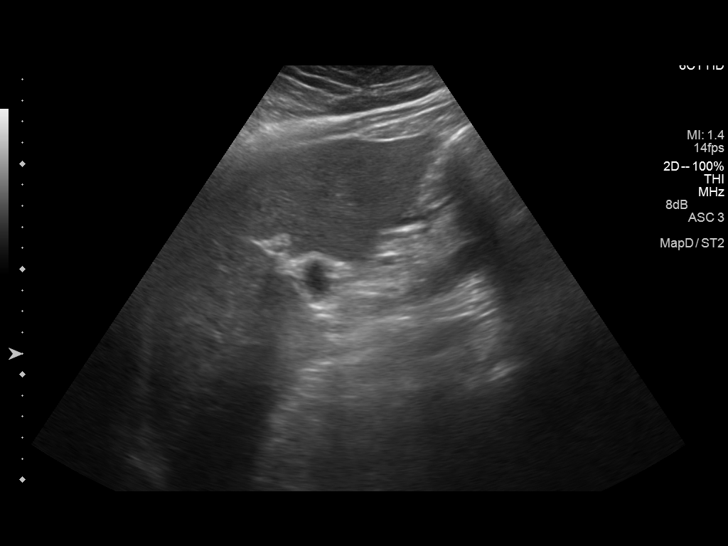
[im 20/32]
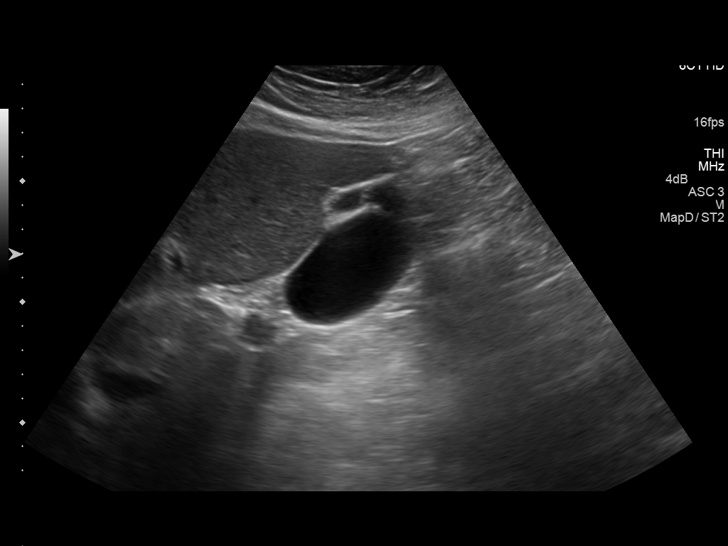
[im 21/32]
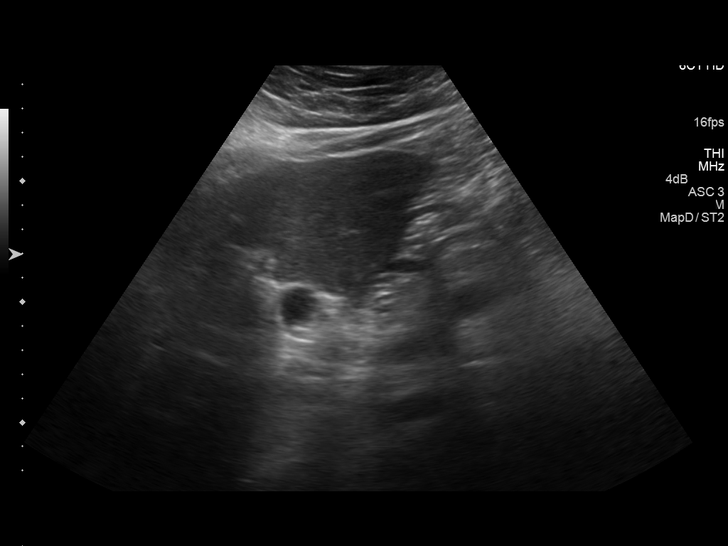
[im 24/32]
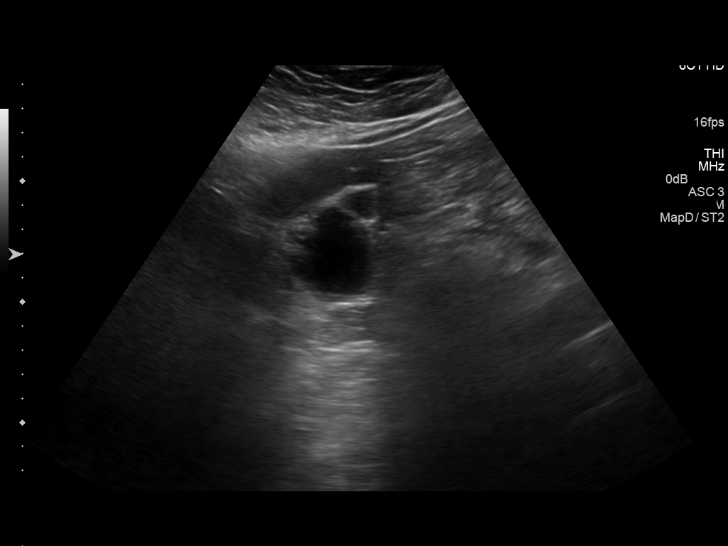
[im 26/32]
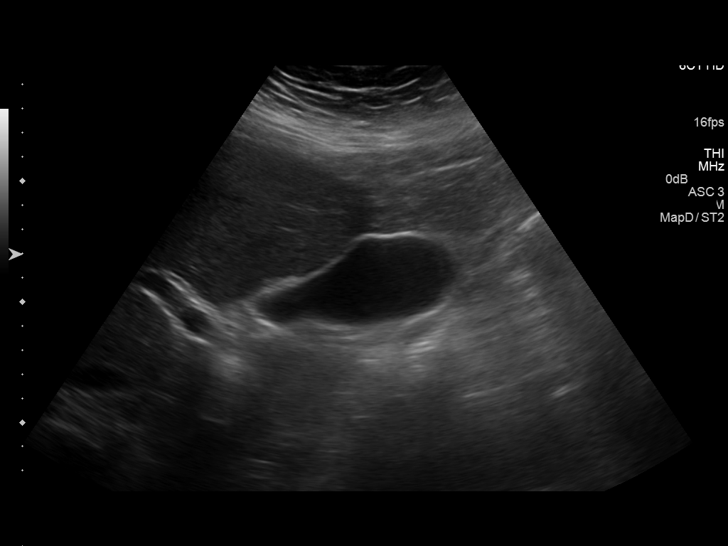
[im 29/32]
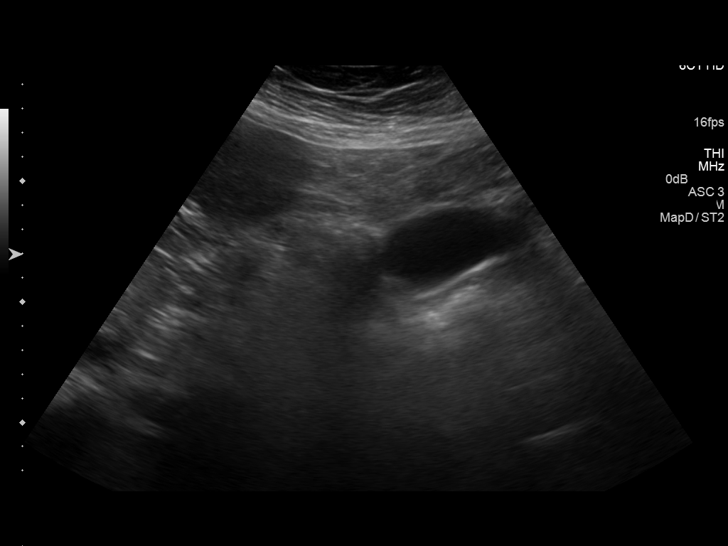
[im 32/32]
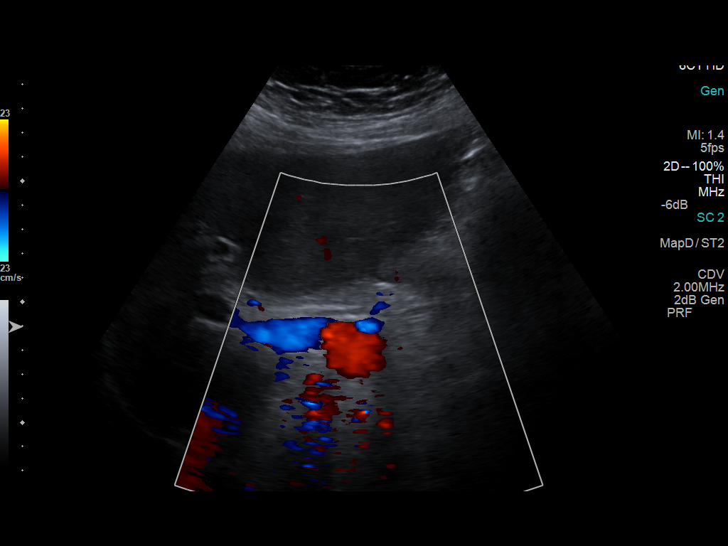

[14 of 25 positions shown; findings below may reference images not displayed]

FINDINGS: Gallbladder:

No gallstones or wall thickening visualized, measuring 2.5 mm. No
sonographic Murphy sign noted. Incidental note made of a Phrygian
cap within the fundus of the gallbladder.

Common bile duct:

Diameter: 3.9 mm

Liver:

No focal lesion identified. Within normal limits in parenchymal
echogenicity.
IMPRESSION: Negative right upper quadrant abdominal ultrasound.

## 2014-10-09 ENCOUNTER — Ambulatory Visit: Payer: Medicare Other

## 2014-10-16 ENCOUNTER — Ambulatory Visit
Admission: RE | Admit: 2014-10-16 | Discharge: 2014-10-16 | Disposition: A | Discharge status: Home / Self Care | Payer: Medicare Other | Source: Ambulatory Visit | Attending: Internal Medicine | Admitting: Internal Medicine

## 2014-10-16 ENCOUNTER — Ambulatory Visit (HOSPITAL_COMMUNITY): Payer: Medicare Other

## 2014-10-16 DIAGNOSIS — N2881 Hypertrophy of kidney: Secondary | ICD-10-CM | POA: Insufficient documentation

## 2014-10-16 DIAGNOSIS — D696 Thrombocytopenia, unspecified: Secondary | ICD-10-CM

## 2014-10-16 DIAGNOSIS — N281 Cyst of kidney, acquired: Secondary | ICD-10-CM | POA: Diagnosis not present

## 2014-10-30 ENCOUNTER — Inpatient Hospital Stay: Payer: Medicare Other | Attending: Family Medicine | Admitting: Family Medicine

## 2014-10-30 ENCOUNTER — Inpatient Hospital Stay: Payer: Medicare Other

## 2014-10-30 ENCOUNTER — Encounter: Payer: Self-pay | Admitting: Family Medicine

## 2014-10-30 VITALS — BP 124/72 | HR 68 | Temp 95.1°F | Resp 18 | Wt 261.0 lb

## 2014-10-30 DIAGNOSIS — D696 Thrombocytopenia, unspecified: Secondary | ICD-10-CM | POA: Diagnosis present

## 2014-10-30 DIAGNOSIS — Z809 Family history of malignant neoplasm, unspecified: Secondary | ICD-10-CM | POA: Insufficient documentation

## 2014-10-30 DIAGNOSIS — Z79899 Other long term (current) drug therapy: Secondary | ICD-10-CM

## 2014-10-30 DIAGNOSIS — D631 Anemia in chronic kidney disease: Secondary | ICD-10-CM

## 2014-10-30 DIAGNOSIS — D649 Anemia, unspecified: Secondary | ICD-10-CM | POA: Insufficient documentation

## 2014-10-30 DIAGNOSIS — M419 Scoliosis, unspecified: Secondary | ICD-10-CM | POA: Insufficient documentation

## 2014-10-30 DIAGNOSIS — I129 Hypertensive chronic kidney disease with stage 1 through stage 4 chronic kidney disease, or unspecified chronic kidney disease: Secondary | ICD-10-CM | POA: Diagnosis not present

## 2014-10-30 DIAGNOSIS — Z803 Family history of malignant neoplasm of breast: Secondary | ICD-10-CM | POA: Diagnosis not present

## 2014-10-30 DIAGNOSIS — M129 Arthropathy, unspecified: Secondary | ICD-10-CM

## 2014-10-30 DIAGNOSIS — E780 Pure hypercholesterolemia: Secondary | ICD-10-CM | POA: Diagnosis not present

## 2014-10-30 DIAGNOSIS — Z800 Family history of malignant neoplasm of digestive organs: Secondary | ICD-10-CM | POA: Insufficient documentation

## 2014-10-30 DIAGNOSIS — N189 Chronic kidney disease, unspecified: Principal | ICD-10-CM

## 2014-10-30 DIAGNOSIS — M722 Plantar fascial fibromatosis: Secondary | ICD-10-CM | POA: Insufficient documentation

## 2014-10-30 DIAGNOSIS — D225 Melanocytic nevi of trunk: Secondary | ICD-10-CM | POA: Insufficient documentation

## 2014-10-30 DIAGNOSIS — Q613 Polycystic kidney, unspecified: Secondary | ICD-10-CM | POA: Insufficient documentation

## 2014-10-30 DIAGNOSIS — F319 Bipolar disorder, unspecified: Secondary | ICD-10-CM | POA: Diagnosis not present

## 2014-10-30 DIAGNOSIS — F419 Anxiety disorder, unspecified: Secondary | ICD-10-CM

## 2014-10-30 DIAGNOSIS — Z7982 Long term (current) use of aspirin: Secondary | ICD-10-CM

## 2014-10-30 LAB — CBC WITH DIFFERENTIAL/PLATELET
BASOS ABS: 0 10*3/uL (ref 0–0.1)
Basophils Relative: 1 %
Eosinophils Absolute: 0.3 10*3/uL (ref 0–0.7)
Eosinophils Relative: 4 %
HCT: 30.1 % — ABNORMAL LOW (ref 40.0–52.0)
Hemoglobin: 10 g/dL — ABNORMAL LOW (ref 13.0–18.0)
LYMPHS PCT: 25 %
Lymphs Abs: 1.9 10*3/uL (ref 1.0–3.6)
MCH: 32.1 pg (ref 26.0–34.0)
MCHC: 33.1 g/dL (ref 32.0–36.0)
MCV: 96.9 fL (ref 80.0–100.0)
MONO ABS: 0.6 10*3/uL (ref 0.2–1.0)
Monocytes Relative: 8 %
Neutro Abs: 4.6 10*3/uL (ref 1.4–6.5)
Neutrophils Relative %: 62 %
Platelets: 164 10*3/uL (ref 150–440)
RBC: 3.1 MIL/uL — AB (ref 4.40–5.90)
RDW: 14.1 % (ref 11.5–14.5)
WBC: 7.5 10*3/uL (ref 3.8–10.6)

## 2014-10-30 LAB — BASIC METABOLIC PANEL
ANION GAP: 6 (ref 5–15)
BUN: 63 mg/dL — ABNORMAL HIGH (ref 6–20)
CO2: 22 mmol/L (ref 22–32)
CREATININE: 3.11 mg/dL — AB (ref 0.61–1.24)
Calcium: 8.9 mg/dL (ref 8.9–10.3)
Chloride: 108 mmol/L (ref 101–111)
GFR calc non Af Amer: 19 mL/min — ABNORMAL LOW (ref >60)
GFR, EST AFRICAN AMERICAN: 22 mL/min — AB (ref >60)
Glucose, Bld: 86 mg/dL (ref 65–99)
Potassium: 4.7 mmol/L (ref 3.5–5.1)
Sodium: 136 mmol/L (ref 135–145)

## 2014-10-30 NOTE — Progress Notes (Signed)
[place]  [phone]  [phone]     Keith Chapman DOB: 1945/07/29  MR#: [id number]  IOX#:[id number]  Patient Care Team: Maryland Pink, MD as PCP - General (Family Medicine)  CHIEF COMPLAINT:  Chief Complaint  Patient presents with  . Follow-up    ultrasound results. states is having chronic pain in both feet due to plantar fascitis. has mole around left axilla area that needs evaluation by provider per patient.    INTERVAL HISTORY:  Patient is here for results of recent ultrasound that was ordered for evaluation of spleen following consultation with Dr. Inez Pilgrim in regards to anemia and thrombocytopenia. Patient was last seen May 12 noted to have a low hemoglobin as well as thrombocytopenia. He is also followed by Dr. Virl Son with [facility] nephrology for chronic renal failure. Baseline creatinine appears to be in the 3 range also known to have polycystic kidney disease. He returns today to discuss lab results as well as ultrasound from June 2.  REVIEW OF SYSTEMS:   Review of Systems  Constitutional: Negative for fever, chills and weight loss.  Respiratory: Negative for cough, shortness of breath and wheezing.   Cardiovascular: Negative for chest pain, palpitations and leg swelling.  Gastrointestinal: Negative for nausea, vomiting, constipation, blood in stool and melena.  Neurological: Negative for dizziness and weakness.  All other systems reviewed and are negative.   As per HPI. Otherwise, a complete review of systems is negatve.  ONCOLOGY HISTORY:  No history exists.    PAST MEDICAL HISTORY: Past Medical History  Diagnosis Date  . Bipolar 1 disorder   . Scoliosis   . Arthritis   . Manic depression   . HTN (hypertension)   . Hypercholesteremia   . Hemorrhoids     PAST SURGICAL HISTORY: Past Surgical History  Procedure Laterality Date  . Colonoscopy  07/15/13    DR. ELLIOTT    FAMILY HISTORY Family History  Problem  Relation Age of Onset  . Breast cancer Mother     GREATER THAN 25 YRS OF AGE; NO LIVING  . Breast cancer Sister     GREATER THAN 59 YRS OLD; still living  . Breast cancer Sister     GREATER THAN 92 YRS OLD  . Colon cancer Father   . Melanoma Father     metstatic melanoma    GYNECOLOGIC HISTORY:  No LMP for male patient.     ADVANCED DIRECTIVES:    HEALTH MAINTENANCE: History  Substance Use Topics  . Smoking status: Never Smoker   . Smokeless tobacco: Never Used  . Alcohol Use: No     Colonoscopy:  PAP:  Bone density:  Lipid panel:  Allergies  Allergen Reactions  . Clonazepam Rash    passed out Other reaction(s): Other (See Comments) passed out Other reaction(s): Other (See Comments) passed out  . Olanzapine Other (See Comments) and Rash    passed out passed out passed out    Current Outpatient Prescriptions  Medication Sig Dispense Refill  . ARIPiprazole (ABILIFY) 15 MG tablet Take by mouth.    Marland Kitchen aspirin EC 81 MG tablet Take 81 mg by mouth.     . Biotin 1000 MCG tablet Take 5,000 mcg by mouth 3 (three) times daily.    Marland Kitchen buPROPion (WELLBUTRIN) 75 MG tablet Take 75 mg by mouth 2 (two) times daily.     . Cholecalciferol (VITAMIN D-1000 MAX ST) 1000 UNITS tablet Take by mouth.    . divalproex (DEPAKOTE) 500  MG DR tablet Take 2,000 mg by mouth at bedtime.     . fenofibrate micronized (LOFIBRA) 67 MG capsule Take 67 mg by mouth.    . Ferrous Sulfate (IRON) 90 (18 FE) MG TABS Take by mouth.    . irbesartan (AVAPRO) 75 MG tablet Take by mouth.    Marland Kitchen oxcarbazepine (TRILEPTAL) 600 MG tablet Take by mouth.    . Probiotic Product (PROBIOTIC FORMULA) CAPS Take 1 capsule by mouth 1 day or 1 dose.    . pyridOXINE (VITAMIN B-6) 100 MG tablet Take by mouth.    . QUEtiapine (SEROQUEL) 400 MG tablet Take 400 mg by mouth at bedtime.     . sildenafil (VIAGRA) 100 MG tablet Take by mouth.    . tamsulosin (FLOMAX) 0.4 MG CAPS capsule Take 0.4 mg by mouth daily.     No current  facility-administered medications for this visit.    OBJECTIVE: BP 124/72 mmHg  Pulse 68  Temp(Src) 95.1 F (35.1 C) (Tympanic)  Resp 18  Wt 261 lb 0.4 oz (118.4 kg)   Body mass index is 37.45 kg/(m^2).    ECOG FS:0 - Asymptomatic  General: Well-developed, well-nourished, no acute distress. Eyes: Pink conjunctiva, anicteric sclera. HEENT: Normocephalic, moist mucous membranes, clear oropharnyx. Lungs: Clear to auscultation bilaterally. Heart: Regular rate and rhythm. No rubs, murmurs, or gallops. Abdomen: Soft, nontender, nondistended. No organomegaly noted, normoactive bowel sounds. Musculoskeletal: No edema, cyanosis, or clubbing. Neuro: Alert, answering all questions appropriately. Cranial nerves grossly intact. Skin: No rashes or petechiae noted. Psych: Normal affect.    LAB RESULTS:     Component Value Date/Time   CREATININE 3.63* 09/08/2014 1520   PROT 6.7 09/08/2014 1520   ALBUMIN 3.2* 09/08/2014 1520   AST 36 09/08/2014 1520   ALT 20 09/08/2014 1520   ALKPHOS 66 09/08/2014 1520   GFRNONAA 16* 09/08/2014 1520   GFRAA 19* 09/08/2014 1520    No results found for: SPEP, UPEP  Lab Results  Component Value Date   WBC 7.1 09/25/2014   NEUTROABS 3.1 09/08/2014   HGB 8.7* 09/25/2014   HCT 26.3* 09/25/2014   MCV 95.8 09/25/2014   PLT 135* 09/25/2014      Chemistry      Component Value Date/Time   CREATININE 3.63* 09/08/2014 1520      Component Value Date/Time   ALKPHOS 66 09/08/2014 1520   AST 36 09/08/2014 1520   ALT 20 09/08/2014 1520       No results found for: LABCA2  No components found for: TDVVO160  No results for input(s): INR in the last 168 hours.  No results found for: COLORURINE, APPEARANCEUR, LABSPEC, Nelson, GLUCOSEU, HGBUR, BILIRUBINUR, KETONESUR, PROTEINUR, UROBILINOGEN, NITRITE, LEUKOCYTESUR  STUDIES: US Abdomen Complete  10/16/2014   CLINICAL DATA:  Thrombocytopenia and anemia  EXAM: ULTRASOUND ABDOMEN COMPLETE  COMPARISON:   10/28/2013  FINDINGS: Gallbladder: No gallstones or wall thickening visualized. No sonographic Murphy sign noted. A Phyrgian cap is noted.  Common bile duct: Diameter: 3.3 mm  Liver: Normal echogenicity without focal lesion or biliary dilatation.  IVC: Normal caliber  Pancreas: Visualized portion unremarkable.  Spleen: Normal size and echogenicity without focal lesions.  Right Kidney: Length: 14.6 cm. Increased echogenicity and numerous small cysts. No hydronephrosis.  Left Kidney: Length: 12.4 cm. Diffuse increased echogenicity and poor cortical medullary distinction. There are numerous cysts. No hydronephrosis.  Abdominal aorta: Normal caliber  Other findings: No ascites  IMPRESSION: 1. Enlarged echogenic kidneys with numerous cysts. Findings suspicious for polycystic kidney  disease. CT or MRI may be helpful for further evaluation depending on renal function. 2. Normal gallbladder and normal caliber common bile duct. 3. Normal liver, spleen and pancreas.   Electronically Signed   By: Marijo Sanes M.D.   On: 10/16/2014 10:46    ASSESSMENT:  1. Anemia. 2. Thrombocytopenia. 3. Chronic renal failure.  PLAN:   1. Anemia. Hemoglobin today is up to 10.0. Iron studies from previous visit revealed no abnormality. ANA was reported as positive. Discussed with Dr. Grayland Ormond, suggested that patient be referred to rheumatology for further workup. Discussed this with patient and he is agreeable. 2. Thrombocytopenia. Previously ordered ultrasound of abdomen for evaluation of spleen reported as negative. Only suspicious findings are enlarged echogenic kidneys with numerous cysts which is consistent with previous exams. Platelet count today is 164,000 and within normal limits. 3. Chronic renal failure. [facility] nephrology to speak with Dr. Virl Son. Was able to speak with Lovey Newcomer in his office. Their office is able to accommodate a follow-up in the next 4 weeks with a nurse practitioner and possibly starting Aranesp  if hemoglobin is below 10. We will have all labs and most recent notes faxed to you and seen nephrology.  Patient expressed understanding and was in agreement with this plan. He also understands that He can call clinic at any time with any questions, concerns, or complaints.     Evlyn Kanner, NP   10/30/2014 12:08 PM

## 2014-12-11 ENCOUNTER — Inpatient Hospital Stay: Payer: Medicare Other | Attending: Hematology and Oncology | Admitting: Hematology and Oncology

## 2014-12-11 VITALS — BP 123/72 | HR 62 | Temp 96.2°F | Resp 18 | Ht 70.0 in | Wt 266.5 lb

## 2014-12-11 DIAGNOSIS — D696 Thrombocytopenia, unspecified: Secondary | ICD-10-CM | POA: Diagnosis not present

## 2014-12-11 DIAGNOSIS — F319 Bipolar disorder, unspecified: Secondary | ICD-10-CM | POA: Insufficient documentation

## 2014-12-11 DIAGNOSIS — M419 Scoliosis, unspecified: Secondary | ICD-10-CM | POA: Insufficient documentation

## 2014-12-11 DIAGNOSIS — N183 Chronic kidney disease, stage 3 (moderate): Secondary | ICD-10-CM | POA: Diagnosis not present

## 2014-12-11 DIAGNOSIS — M1990 Unspecified osteoarthritis, unspecified site: Secondary | ICD-10-CM | POA: Insufficient documentation

## 2014-12-11 DIAGNOSIS — Z7982 Long term (current) use of aspirin: Secondary | ICD-10-CM | POA: Diagnosis not present

## 2014-12-11 DIAGNOSIS — Z79899 Other long term (current) drug therapy: Secondary | ICD-10-CM | POA: Insufficient documentation

## 2014-12-11 DIAGNOSIS — N189 Chronic kidney disease, unspecified: Secondary | ICD-10-CM

## 2014-12-11 DIAGNOSIS — E780 Pure hypercholesterolemia: Secondary | ICD-10-CM | POA: Insufficient documentation

## 2014-12-11 DIAGNOSIS — I129 Hypertensive chronic kidney disease with stage 1 through stage 4 chronic kidney disease, or unspecified chronic kidney disease: Secondary | ICD-10-CM | POA: Diagnosis present

## 2014-12-11 DIAGNOSIS — D631 Anemia in chronic kidney disease: Secondary | ICD-10-CM | POA: Insufficient documentation

## 2014-12-11 NOTE — Progress Notes (Signed)
Pt here today and he had labs drawn here last time for low hgb. Since he has read the results on my chart. He went to see Dr. Cherlynn Kaiser at [facility] nephrology and put him on epo to help with hgb.  States his kidneys is shot because of long years of use of lithium and lots of years of ibuprofen.  Since he is going there he sees no need to cont. Here but wanted to come to discuss results.

## 2015-06-27 ENCOUNTER — Encounter: Payer: Self-pay | Admitting: Hematology and Oncology

## 2015-06-27 NOTE — Progress Notes (Signed)
[facility] day:  12/11/2014  Chief Complaint: Keith Chapman is a 70 y.o. male with anemia and thrombocytopenia who is seen for reassessment.  HPI:  The patient has a history of anemia and thrombocytopenia dating back to 2007.  Patient initially seen in consultation by Dr. Inez Pilgrim on 09/08/2014. He was noted to have a hemoglobin of 11.7 and 2007, 9.1 on 08/13/2014, and 8.2 on 09/08/2014.  Thrombcytopenia was documented with a platelet count of 102,000 on 08/13/2014 and 118,000 on 09/08/2014. He has known chronic renal insufficiency with a creatinine of 3.2 in 2004 and 3.9 and 2007.  He underwent a workup on 09/08/2014. CBC included a hematocrit of 25.2, hemoglobin 8.2, platelets 112,000, white count 4800 with an ANC of 3,100. Coombs was negative. SPEP revealed no monoclonal protein. B12 was 665. Ferritin was 260 with an iron saturation of 28.7%.  LDH was 178.  Kappa free light chains were 89.33, lambda free light chains 43.68 with a ratio of 2.05 (0.26-1.65).  ANA was positive speckled 1:160 on 09/25/2014.  An abdominal ultrasound was ordered.  Ultrasound on 11/09/2014 revealed a large echogenic kidneys with numerous cysts suspicious for polycystic kidney disease. The spleen was normal size.  He was seen by Dr. by Georgeanne Nim, NP, on 10/30/2014. Because of a positive ANA, he was referred to rheumatology. He states he has an appointment with the [facility] on 12/18/2014. Dr. Juanito Doom was contacted at [facility] nephrology for possible initiation of Aranesp for anemia due to renal insufficiency.  He notes no blood in his stool. His last colonoscopy was in 2015. He scheduled for one in 5 years.  He notes that his diet is good.   He saw Dr. Juanito Doom who noted to chronic renal insufficiency likely due to ibuprofen. Creatinine clearance is 19-20 ml/min.  He is started Procrit/Aranesp in 11/2014.   CBC on 11/27/2014 revealed a hematocrit of 30,  hemoglobin 9.7, platelets 135,000, and WBC 6,800.  Symptomatically, he denies any complaint.  Past Medical History  Diagnosis Date  . Bipolar 1 disorder   . Scoliosis   . Arthritis   . Manic depression   . HTN (hypertension)   . Hypercholesteremia   . Hemorrhoids     Past Surgical History  Procedure Laterality Date  . Colonoscopy  07/15/13    DR. ELLIOTT    Family History  Problem Relation Age of Onset  . Breast cancer Mother     GREATER THAN 26 YRS OF AGE; NO LIVING  . Breast cancer Sister     GREATER THAN 71 YRS OLD; still living  . Breast cancer Sister     GREATER THAN 21 YRS OLD  . Colon cancer Father   . Melanoma Father     metstatic melanoma    Social History:  reports that he has never smoked. He has never used smokeless tobacco. He reports that he does not drink alcohol. His drug history is not on file.   Allergies:  Allergies  Allergen Reactions  . Clonazepam Rash    passed out Other reaction(s): Other (See Comments) passed out Other reaction(s): Other (See Comments) passed out  . Olanzapine Other (See Comments) and Rash    passed out passed out passed out    Current Medications: Current Outpatient Prescriptions  Medication Sig Dispense Refill  . ARIPiprazole (ABILIFY) 15 MG tablet Take by mouth.    Marland Kitchen aspirin EC 81 MG tablet Take 81 mg by mouth.     Marland Kitchen  Biotin 1000 MCG tablet Take 5,000 mcg by mouth 3 (three) times daily.    Marland Kitchen buPROPion (WELLBUTRIN) 75 MG tablet Take 75 mg by mouth 2 (two) times daily.     . Cholecalciferol (VITAMIN D-1000 MAX ST) 1000 UNITS tablet Take by mouth.    . divalproex (DEPAKOTE) 500 MG DR tablet Take 2,000 mg by mouth at bedtime.     . fenofibrate micronized (LOFIBRA) 67 MG capsule Take 67 mg by mouth.    . Ferrous Sulfate (IRON) 90 (18 FE) MG TABS Take by mouth.    . irbesartan (AVAPRO) 75 MG tablet Take by mouth.    Marland Kitchen oxcarbazepine (TRILEPTAL) 600 MG tablet Take by mouth.    . Probiotic Product (PROBIOTIC FORMULA) CAPS  Take 1 capsule by mouth 1 day or 1 dose.    . pyridOXINE (VITAMIN B-6) 100 MG tablet Take by mouth.    . QUEtiapine (SEROQUEL) 400 MG tablet Take 400 mg by mouth at bedtime.     . tamsulosin (FLOMAX) 0.4 MG CAPS capsule Take 0.4 mg by mouth daily.    . sildenafil (VIAGRA) 100 MG tablet Take by mouth.     No current facility-administered medications for this visit.    Review of Systems:  GENERAL:  Feels fine.  No fevers, sweats or weight loss. PERFORMANCE STATUS (ECOG):  1 HEENT:  No visual changes, runny nose, sore throat, mouth sores or tenderness. Lungs: No shortness of breath or cough.  No hemoptysis. Cardiac:  No chest pain, palpitations, orthopnea, or PND. GI:  No nausea, vomiting, diarrhea, constipation, melena or hematochezia. GU:  Chronic renal insufficiency.  No urgency, frequency, dysuria, or hematuria. Musculoskeletal:  No back pain.  No joint pain.  No muscle tenderness. Extremities:  No pain or swelling. Skin:  No rashes or skin changes. Neuro:  No headache, numbness or weakness, balance or coordination issues. Endocrine:  No diabetes, thyroid issues, hot flashes or night sweats. Psych:  No mood changes, depression or anxiety. Pain:  No focal pain. Review of systems:  All other systems reviewed and found to be negative.  Physical Exam: Blood pressure 123/72, pulse 62, temperature 96.2 F (35.7 C), resp. rate 18, height '5\' 10"'$  (1.778 m), weight 266 lb 8.6 oz (120.9 kg). GENERAL:  Well developed, well nourished, gentleman wearing suspenders and sitting comfortably in the exam room in no acute distress. MENTAL STATUS:  Alert and oriented to person, place and time. HEAD:  Brown hair.  Male pattern baldness.  Mustache.  Normocephalic, atraumatic, face symmetric, no Cushingoid features. EYES:  Glasses.  Blue eyes.  Pupils equal round and reactive to light and accomodation.  No conjunctivitis or scleral icterus. ENT:  Oropharynx clear without lesion.  Tongue normal. Mucous  membranes moist.  RESPIRATORY:  Clear to auscultation without rales, wheezes or rhonchi. CARDIOVASCULAR:  Regular rate and rhythm without murmur, rub or gallop. ABDOMEN:  Fully round.  Soft, non-tender, with active bowel sounds, and no hepatosplenomegaly.  No masses. SKIN:  No rashes, ulcers or lesions. EXTREMITIES: Wearing Ted hose.  Chronic lower extremity changes.  No skin discoloration or tenderness.  No palpable cords. LYMPH NODES: No palpable cervical, supraclavicular, axillary or inguinal adenopathy  NEUROLOGICAL: Unremarkable. PSYCH:  Appropriate.  No visits with results within 3 Day(s) from this visit. Latest known visit with results is:  Appointment on 10/30/2014  Component Date Value Ref Range Status  . WBC 10/30/2014 7.5  3.8 - 10.6 K/uL Final  . RBC 10/30/2014 3.10* 4.40 - 5.90 MIL/uL  Final  . Hemoglobin 10/30/2014 10.0* 13.0 - 18.0 g/dL Final  . HCT 10/30/2014 30.1* 40.0 - 52.0 % Final  . MCV 10/30/2014 96.9  80.0 - 100.0 fL Final  . MCH 10/30/2014 32.1  26.0 - 34.0 pg Final  . MCHC 10/30/2014 33.1  32.0 - 36.0 g/dL Final  . RDW 10/30/2014 14.1  11.5 - 14.5 % Final  . Platelets 10/30/2014 164  150 - 440 K/uL Final  . Neutrophils Relative % 10/30/2014 62   Final  . Neutro Abs 10/30/2014 4.6  1.4 - 6.5 K/uL Final  . Lymphocytes Relative 10/30/2014 25   Final  . Lymphs Abs 10/30/2014 1.9  1.0 - 3.6 K/uL Final  . Monocytes Relative 10/30/2014 8   Final  . Monocytes Absolute 10/30/2014 0.6  0.2 - 1.0 K/uL Final  . Eosinophils Relative 10/30/2014 4   Final  . Eosinophils Absolute 10/30/2014 0.3  0 - 0.7 K/uL Final  . Basophils Relative 10/30/2014 1   Final  . Basophils Absolute 10/30/2014 0.0  0 - 0.1 K/uL Final  . Sodium 10/30/2014 136  135 - 145 mmol/L Final  . Potassium 10/30/2014 4.7  3.5 - 5.1 mmol/L Final  . Chloride 10/30/2014 108  101 - 111 mmol/L Final  . CO2 10/30/2014 22  22 - 32 mmol/L Final  . Glucose, Bld 10/30/2014 86  65 - 99 mg/dL Final  . BUN  10/30/2014 63* 6 - 20 mg/dL Final  . Creatinine, Ser 10/30/2014 3.11* 0.61 - 1.24 mg/dL Final  . Calcium 10/30/2014 8.9  8.9 - 10.3 mg/dL Final  . GFR calc non Af Amer 10/30/2014 19* >60 mL/min Final  . GFR calc Af Amer 10/30/2014 22* >60 mL/min Final   Comment: (NOTE) The eGFR has been calculated using the CKD EPI equation. This calculation has not been validated in all clinical situations. eGFR's persistently <60 mL/min signify possible Chronic Kidney Disease.   Georgiann Hahn gap 10/30/2014 6  5 - 15 Final    Assessment:  Keith Chapman is a 70 y.o. male with chronic renal insufficiency and associated anemia of chronic renal disease.  Creatinine clearance is 19-20 ml/min.  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  He began Procrit/Aranesp in 11/2014.  Workup on 09/08/2014 revealed a hematocrit of 25.2, hemoglobin 8.2, platelets 112,000, white count 4800 with an ANC of 3,100. Normal studies included:  Coombs. SPEP, B12. ferritin (260), iron saturation (29%), and LDH.  Free light chain ratio was slightly elevated likely due to CRI.  ANA was positive speckled 1:160 on 09/25/2014.  Abdominal ultrasound on 11/09/2014 revealed a large echogenic kidneys with numerous cysts suspicious for polycystic kidney disease. The spleen was normal size.   Colonoscopy in 2015 was negative per patient. He scheduled for one in 5 years.  Diet is good.  Symptomatically, he is doing well.  Exam is unremarkable.  Plan: 1. Review medical history, diagnosis and management of anemia due to chronic renal disease as well as thrombocytopenia felt most likely due to ITP. Patient will be receiving his Procrit/Aranesp with Dr. Juanito Doom.  Given his mild thrombocytopenia, no treatment is indicated for his thrombocytopenia. If his platelet count would fall and approach 50,000, he should return for reassessment and treatment. 2. Follow-up with rheumatology  at the [facility] on 12/18/2014 re: + ANA. 3. RTC prn  Lequita Asal, MD  12/11/2014

## 2015-09-17 IMAGING — US US ABDOMEN COMPLETE
1 series · 14 of 25 positions shown · non-contrast
Comparison: 10/28/2013

CLINICAL DATA: Thrombocytopenia and anemia

EXAM:
ULTRASOUND ABDOMEN COMPLETE

[Series 1: us abdomen complete · 0.31mm/px · 14 of 125 slices shown]
[im 1/125]
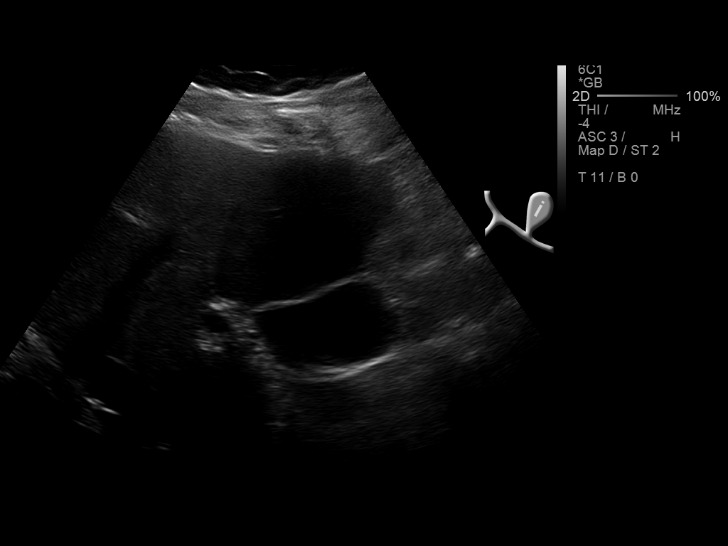
[im 11/125]
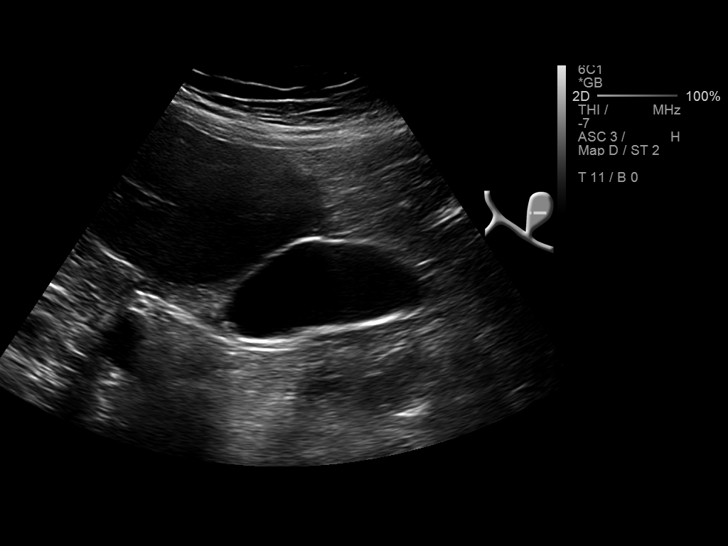
[im 21/125]
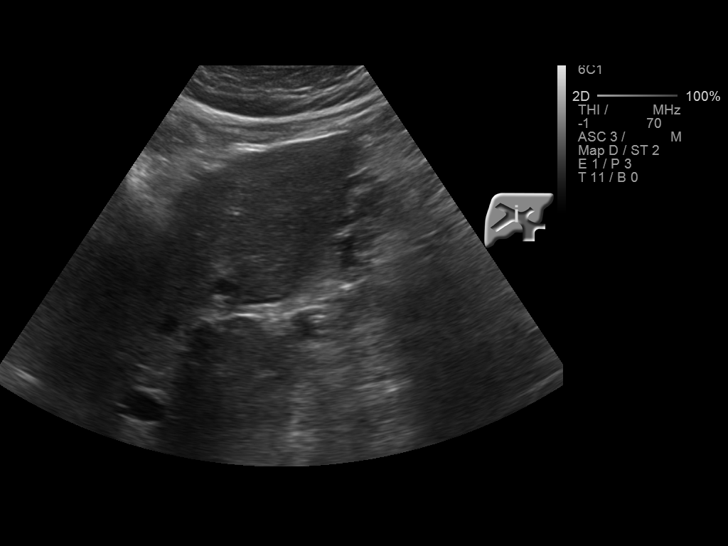
[im 32/125]
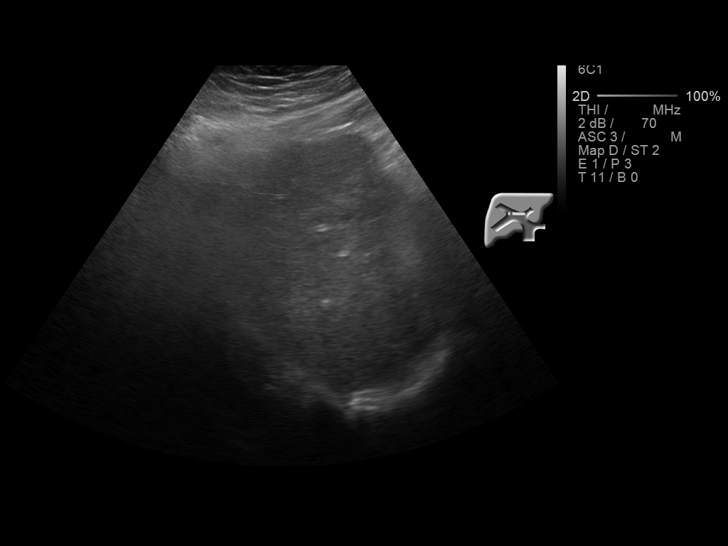
[im 42/125]
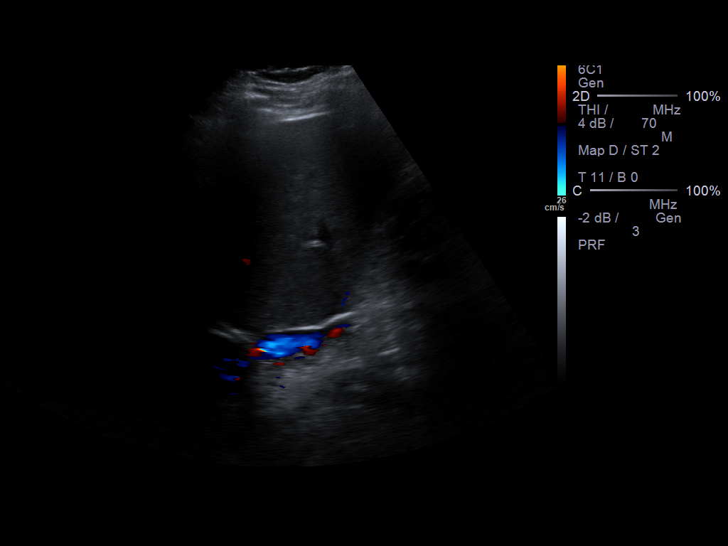
[im 47/125]
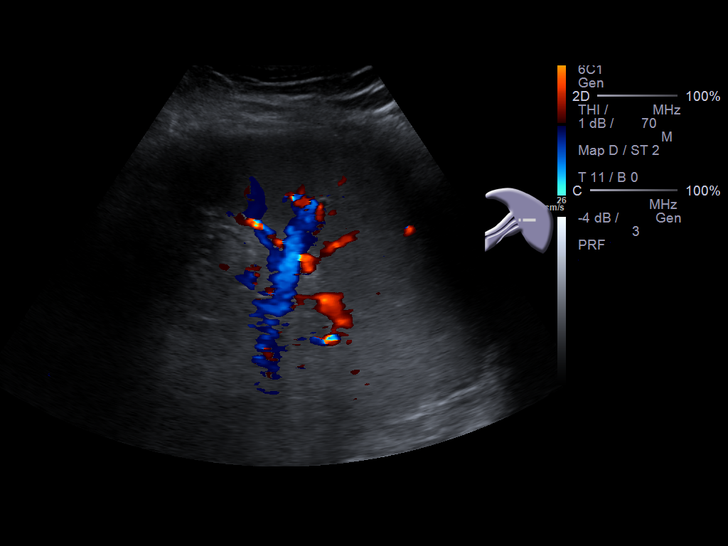
[im 57/125]
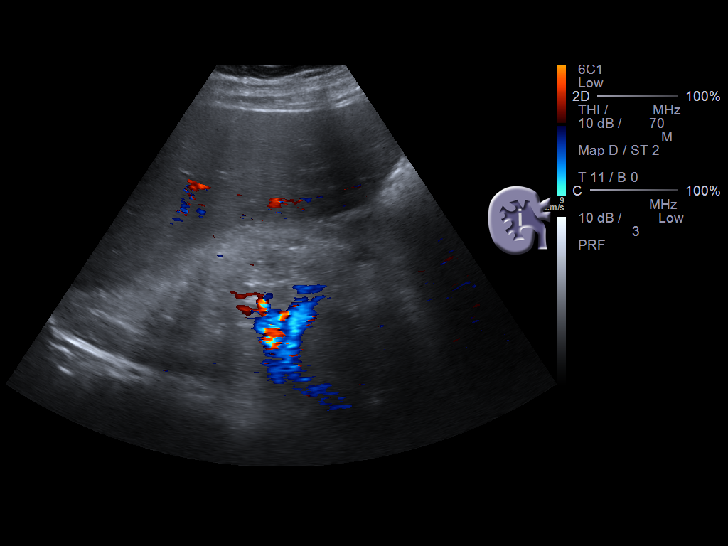
[im 68/125]
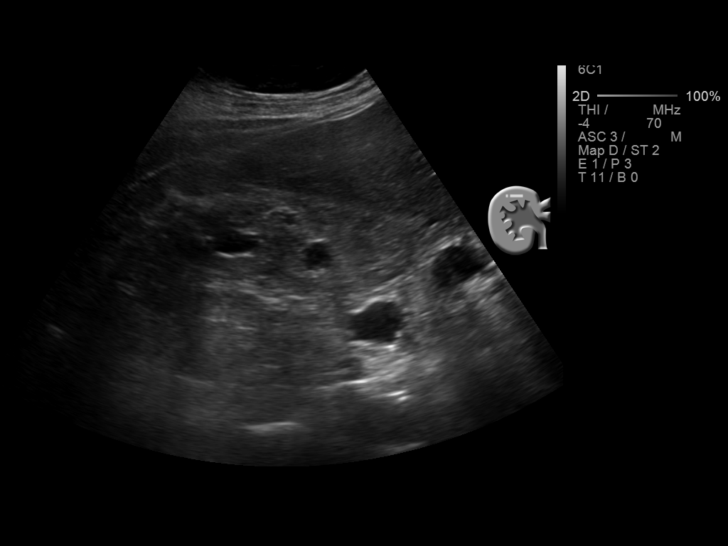
[im 78/125]
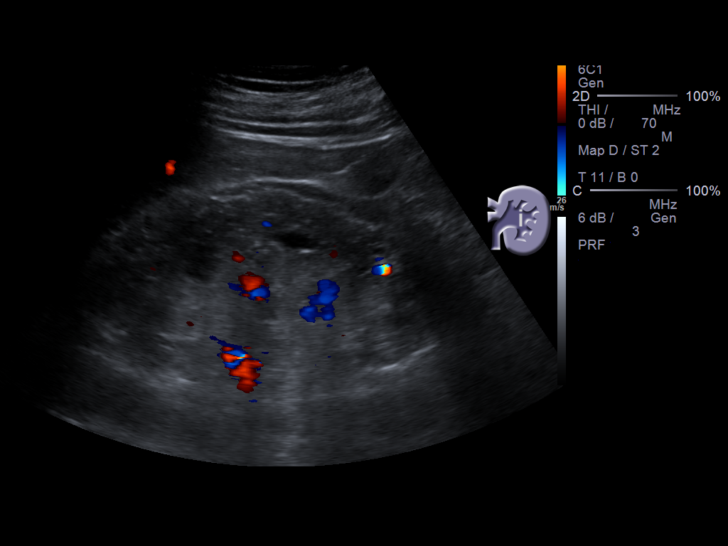
[im 83/125]
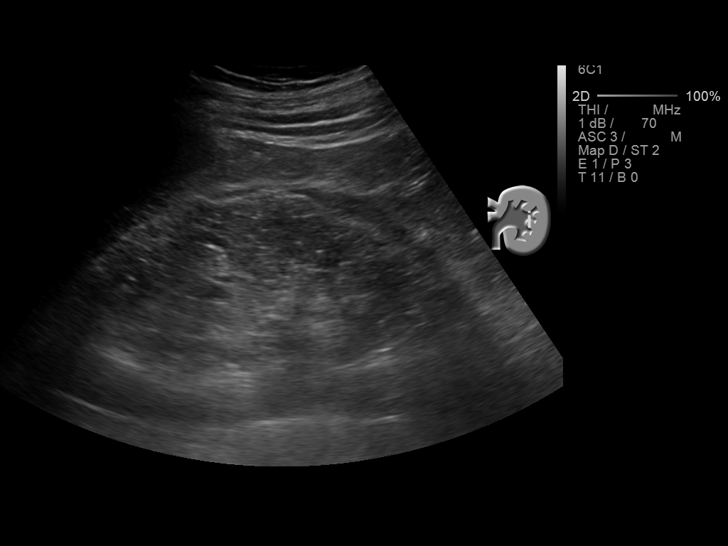
[im 94/125]
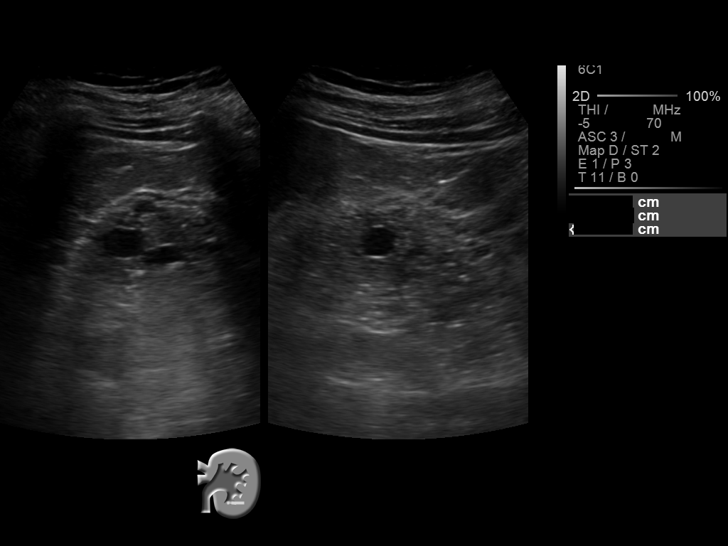
[im 104/125]
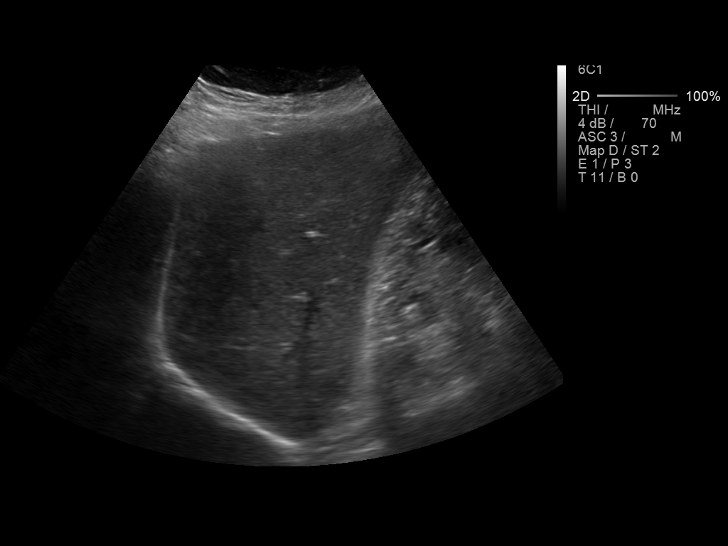
[im 114/125]
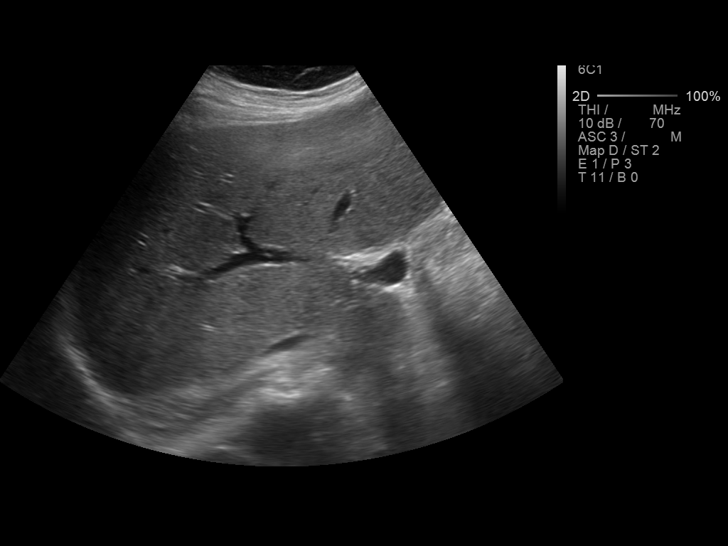
[im 125/125]
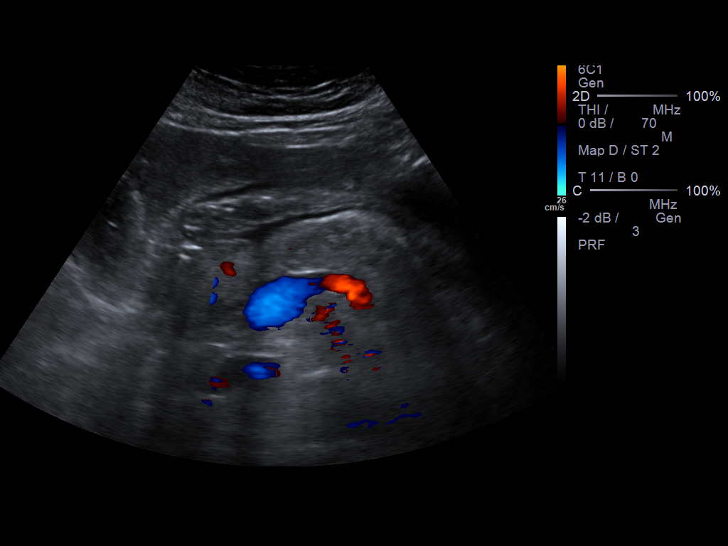

[14 of 25 positions shown; findings below may reference images not displayed]

FINDINGS: Gallbladder: No gallstones or wall thickening visualized. No
sonographic Murphy sign noted. A Phyrgian cap is noted.

Common bile duct: Diameter: 3.3 mm

Liver: Normal echogenicity without focal lesion or biliary
dilatation.

IVC: Normal caliber

Pancreas: Visualized portion unremarkable.

Spleen: Normal size and echogenicity without focal lesions.

Right Kidney: Length: 14.6 cm. Increased echogenicity and numerous
small cysts. No hydronephrosis.

Left Kidney: Length: 12.4 cm. Diffuse increased echogenicity and
poor cortical medullary distinction. There are numerous cysts. No
hydronephrosis.

Abdominal aorta: Normal caliber

Other findings: No ascites
IMPRESSION: 1. Enlarged echogenic kidneys with numerous cysts. Findings
suspicious for polycystic kidney disease. CT or MRI may be helpful
for further evaluation depending on renal function.
2. Normal gallbladder and normal caliber common bile duct.
3. Normal liver, spleen and pancreas.

## 2015-10-29 ENCOUNTER — Emergency Department
Admission: EM | Admit: 2015-10-29 | Discharge: 2015-11-14 | Disposition: E | Payer: Medicare Other | Attending: Emergency Medicine | Admitting: Emergency Medicine

## 2015-10-29 ENCOUNTER — Encounter: Payer: Self-pay | Admitting: Emergency Medicine

## 2015-10-29 DIAGNOSIS — I469 Cardiac arrest, cause unspecified: Secondary | ICD-10-CM | POA: Insufficient documentation

## 2015-10-29 LAB — GLUCOSE, CAPILLARY: GLUCOSE-CAPILLARY: 188 mg/dL — AB (ref 65–99)

## 2015-10-29 MED ORDER — EPINEPHRINE HCL 0.1 MG/ML IJ SOSY
PREFILLED_SYRINGE | INTRAMUSCULAR | Status: AC | PRN
  Administered 2015-10-29 (×5): 1 via INTRAVENOUS

## 2015-10-29 MED ORDER — CALCIUM CHLORIDE 10 % IV SOLN
INTRAVENOUS | Status: AC | PRN
  Administered 2015-10-29: 1 g via INTRAVENOUS

## 2015-10-29 MED ORDER — SODIUM BICARBONATE 8.4 % IV SOLN
INTRAVENOUS | Status: AC | PRN
  Administered 2015-10-29: 50 meq via INTRAVENOUS

## 2015-10-30 MED FILL — Medication: Qty: 1 | Status: AC

## 2015-11-14 DIAGNOSIS — 419620001 Death: Secondary | SNOMED CT | POA: Diagnosis not present

## 2015-11-14 NOTE — Code Documentation (Signed)
Pulse check, US shows no cardiac movement, CPR resumed.

## 2015-11-14 NOTE — Code Documentation (Signed)
Lucas device removed from patient, CPR started.

## 2015-11-14 NOTE — Code Documentation (Signed)
Pulse check, pt in asystole, CPR resumed.

## 2015-11-14 NOTE — ED Provider Notes (Signed)
[facility] Emergency Department Provider Note   ____________________________________________  Time seen: On EMS arrival  I have reviewed the triage vital signs and the nursing notes.   HISTORY  Chief Complaint Cardiac Arrest   History limited by: Unresponsive. History obtained from EMS   HPI Keith Chapman is a 70 y.o. male with history of hypertension and hyperlipidemia brought in by EMS for cardiac arrest. Per EMS the patient had been walking out of the CM which stop when he collapsed. Her fighters were first on scene and started CPR. Per EMS there station is roughly 5 minutes away. Unclear if there was any bystander CPR. Initial rhythm was asystole.At one point EMS was able to get pulses back. They think that that lasted maybe 5 seconds. During that time they thought they saw a wide complex tachycardia. The patient then became pulseless and returned to asystole. The patient received multiple rounds of epinephrine and Narcan by EMS.   Past Medical History  Diagnosis Date  . Bipolar 1 disorder ([place])   . Scoliosis   . Arthritis   . Manic depression ([place])   . HTN (hypertension)   . Hypercholesteremia   . Hemorrhoids     Patient Active Problem List   Diagnosis Date Noted  . Anemia of renal disease 12/11/2014  . Thrombocytopenia ([place]) 12/11/2014  . Breathlessness on exertion 04/01/2013  . Need for vaccination 04/01/2013    Past Surgical History  Procedure Laterality Date  . Colonoscopy  07/15/13    DR. ELLIOTT    Current Outpatient Rx  Name  Route  Sig  Dispense  Refill  . ARIPiprazole (ABILIFY) 15 MG tablet   Oral   Take by mouth.         Marland Kitchen aspirin EC 81 MG tablet   Oral   Take 81 mg by mouth.          . Biotin 1000 MCG tablet   Oral   Take 5,000 mcg by mouth 3 (three) times daily.         Marland Kitchen buPROPion (WELLBUTRIN) 75 MG tablet   Oral   Take 75 mg by mouth 2 (two) times daily.          . Cholecalciferol (VITAMIN  D-1000 MAX ST) 1000 UNITS tablet   Oral   Take by mouth.         . divalproex (DEPAKOTE) 500 MG DR tablet   Oral   Take 2,000 mg by mouth at bedtime.          . fenofibrate micronized (LOFIBRA) 67 MG capsule   Oral   Take 67 mg by mouth.         . Ferrous Sulfate (IRON) 90 (18 FE) MG TABS   Oral   Take by mouth.         . irbesartan (AVAPRO) 75 MG tablet   Oral   Take by mouth.         Marland Kitchen oxcarbazepine (TRILEPTAL) 600 MG tablet   Oral   Take by mouth.         . Probiotic Product (PROBIOTIC FORMULA) CAPS   Oral   Take 1 capsule by mouth 1 day or 1 dose.         . pyridOXINE (VITAMIN B-6) 100 MG tablet   Oral   Take by mouth.         . QUEtiapine (SEROQUEL) 400 MG tablet   Oral   Take 400 mg by mouth at  bedtime.          Marland Kitchen EXPIRED: sildenafil (VIAGRA) 100 MG tablet   Oral   Take by mouth.         . tamsulosin (FLOMAX) 0.4 MG CAPS capsule   Oral   Take 0.4 mg by mouth daily.           Allergies Clonazepam and Olanzapine  Family History  Problem Relation Age of Onset  . Breast cancer Mother     GREATER THAN 85 YRS OF AGE; NO LIVING  . Breast cancer Sister     GREATER THAN 66 YRS OLD; still living  . Breast cancer Sister     GREATER THAN 47 YRS OLD  . Colon cancer Father   . Melanoma Father     metstatic melanoma    Social History Social History  Substance Use Topics  . Smoking status: Never Smoker   . Smokeless tobacco: Never Used  . Alcohol Use: No    Review of Systems Unable to obtain secondary to cardiopulmonary arrest  ____________________________________________   PHYSICAL EXAM:  Constitutional: Unresponsive. Lucas device on patient. Eyes: Conjunctivae are normal. Pupils 4 mm and fixed.  ENT   Mouth/Throat: King airway in place Cardiovascular: Asystolic on initial rhythm check. Respiratory: King airway in place, actively being bagged.  Gastrointestinal: Soft. Distended.  Genitourinary:  Deferred Musculoskeletal: No deformities. Neurologic:  Unresponsive. Skin:  Skin is pale  ____________________________________________    LABS (pertinent positives/negatives)  Glucose 188  ____________________________________________   EKG  None  ____________________________________________    RADIOLOGY  None  ____________________________________________   PROCEDURES  Procedure(s) performed: CPR, see procedure note(s).  Critical Care performed: Yes, see critical care note(s)  Cardiopulmonary Resuscitation (CPR) Procedure Note Directed/Performed by: Nance Pear I personally directed ancillary staff and/or performed CPR in an effort to regain return of spontaneous circulation and to maintain cardiac, neuro and systemic perfusion.   CRITICAL CARE Performed by: Nance Pear   Total critical care time: 25 minutes  Critical care time was exclusive of separately billable procedures and treating other patients.  Critical care was necessary to treat or prevent imminent or life-threatening deterioration.  Critical care was time spent personally by me on the following activities: development of treatment plan with patient and/or surrogate as well as nursing, discussions with consultants, evaluation of patient's response to treatment, examination of patient, obtaining history from patient or surrogate, ordering and performing treatments and interventions, ordering and review of laboratory studies, ordering and review of radiographic studies, pulse oximetry and re-evaluation of patient's condition.  ____________________________________________   INITIAL IMPRESSION / ASSESSMENT AND PLAN / ED COURSE  Pertinent labs & imaging results that were available during my care of the patient were reviewed by me and considered in my medical decision making (see chart for details).  Patient presented to the emergency department brought in by EMS with cardiopulmonary arrest.  Patient had undergone multiple rounds of CPR prior to arrival to the hospital. There is a question of return of spontaneous circulation for 5 seconds in the field. Initial rhythm here in the emergency Department was asystole. The patient did not have any other rhythms here in the emergency department. He had rectal multiple rounds of CPR in the emergency department receiving multiple rounds of epinephrine as well as 1 time dose of calcium and sodium bicarbonate. Bedside cardiac echocardiogram was done which did not show any cardiac motion. The patient was pronounced dead.  ____________________________________________   FINAL CLINICAL IMPRESSION(S) / ED DIAGNOSES  Final  diagnoses:  Cardiopulmonary arrest [facility])     Note: This dictation was prepared with Dragon dictation. Any transcriptional errors that result from this process are unintentional    Nance Pear, MD Nov 26, 2015 1447

## 2015-11-14 NOTE — ED Notes (Signed)
Attempted to call sisters x2.

## 2015-11-14 NOTE — Code Documentation (Signed)
Patient time of death occurred at 42.

## 2015-11-14 NOTE — Code Documentation (Signed)
Pulse check, pt in asystole, no cardiac movement on [place].

## 2015-11-14 NOTE — Code Documentation (Addendum)
Pulse check, pt in asystole, CPR resumed.

## 2015-11-14 NOTE — Code Documentation (Signed)
CBG- 188 

## 2015-11-14 NOTE — Code Documentation (Signed)
Pulse check, pt in asystole. CPR resumed.

## 2015-11-14 NOTE — Progress Notes (Signed)
Assisted in effort to reach family member. Silent prayer for patient.

## 2015-11-14 NOTE — ED Notes (Addendum)
Pt here from [place] via [place], CPR in progress. EMS reports pt collapsed at [employer] and FD started compressions at 1304. Pt arrived with IO in right tibia, king airway intact. EMS reports return of pulses for approximately 5 seconds around 1335. EMS reports giving 6 epi and 2 narcan en route. Pt also received 250cc of NS en route.

## 2015-11-14 DEATH — deceased
